# Patient Record
Sex: Male | Born: 1955
Health system: Southern US, Community
[De-identification: ages and names within clinical notes are randomized; demographics above are authoritative.]

## PROBLEM LIST (undated history)

## (undated) DIAGNOSIS — I10 Essential (primary) hypertension: Secondary | ICD-10-CM

## (undated) HISTORY — DX: Essential (primary) hypertension: I10

## (undated) HISTORY — PX: HERNIA REPAIR: SHX51

## (undated) HISTORY — PX: BACK SURGERY: SHX140

## (undated) HISTORY — PX: TONSILLECTOMY: SUR1361

---

## 2015-06-16 ENCOUNTER — Encounter: Payer: Self-pay | Admitting: Sports Medicine

## 2015-06-16 ENCOUNTER — Ambulatory Visit (INDEPENDENT_AMBULATORY_CARE_PROVIDER_SITE_OTHER): Payer: Commercial Managed Care - HMO | Admitting: Sports Medicine

## 2015-06-16 VITALS — BP 147/98 | HR 66 | Wt 204.0 lb

## 2015-06-16 DIAGNOSIS — E781 Pure hyperglyceridemia: Secondary | ICD-10-CM | POA: Diagnosis not present

## 2015-06-16 DIAGNOSIS — Z0000 Encounter for general adult medical examination without abnormal findings: Secondary | ICD-10-CM

## 2015-06-16 DIAGNOSIS — I10 Essential (primary) hypertension: Secondary | ICD-10-CM

## 2015-06-16 DIAGNOSIS — L821 Other seborrheic keratosis: Secondary | ICD-10-CM

## 2015-06-16 MED ORDER — FENOFIBRATE 160 MG PO TABS: 160.0000 mg | ORAL_TABLET | Freq: Every day | ORAL | Status: DC

## 2015-06-16 NOTE — Progress Notes (Signed)
  Subjective:    CC: Establish care.   HPI:  Hypertriglyceridemia: 1200, has never been treated, on review of lipid panels from [facility], he has typically been 300 or greater even when fasting.  Hypertension: No headaches, visual changes, chest pain, stable on lisinopril  Skin lesions: Localized over his chest, back, neck, wonders if these are melanomas.  Preventative measures: Colonoscopy was done at age 59, 7 years ago.  Past medical history, Surgical history, Family history not pertinant except as noted below, Social history, Allergies, and medications have been entered into the medical record, reviewed, and no changes needed.   Review of Systems: No headache, visual changes, nausea, vomiting, diarrhea, constipation, dizziness, abdominal pain, skin rash, fevers, chills, night sweats, swollen lymph nodes, weight loss, chest pain, body aches, joint swelling, muscle aches, shortness of breath, mood changes, visual or auditory hallucinations.  Objective:    General: Well Developed, well nourished, and in no acute distress.  Neuro: Alert and oriented x3, extra-ocular muscles intact, sensation grossly intact.  HEENT: Normocephalic, atraumatic, pupils equal round reactive to light, neck supple, no masses, no lymphadenopathy, thyroid nonpalpable.  Skin: Warm and dry, no rashes noted. There are 3 seborrheic keratoses, one on the chest, one in the back, one on the right neck Cardiac: Regular rate and rhythm, no murmurs rubs or gallops.  Respiratory: Clear to auscultation bilaterally. Not using accessory muscles, speaking in full sentences.  Abdominal: Soft, nontender, nondistended, positive bowel sounds, no masses, no organomegaly.  Musculoskeletal: Shoulder, elbow, wrist, hip, knee, ankle stable, and with full range of motion.  Procedure:  Cryodestruction of  3 seborrheic keratoses  Consent obtained and verified. Time-out conducted. Noted no overlying erythema, induration, or other signs of  local infection. Completed without difficulty using Cryo-Gun. Advised to call if fevers/chills, erythema, induration, drainage, or persistent bleeding.  Impression and Recommendations:    The patient was counselled, risk factors were discussed, anticipatory guidance given.

## 2015-06-16 NOTE — Assessment & Plan Note (Signed)
Up-to-date on screening measures, colonoscopy was when he was 52.

## 2015-06-16 NOTE — Assessment & Plan Note (Signed)
Triglycerides were well over 1000, he does need to start fenofibrate. Recheck triglycerides in 3 months.

## 2015-06-16 NOTE — Assessment & Plan Note (Signed)
Continue lisinopril, no changes in dosage yet.

## 2015-06-16 NOTE — Assessment & Plan Note (Signed)
Cryotherapy of seborrheic keratosis 3.

## 2015-06-17 LAB — HEMOGLOBIN A1C
Hgb A1c MFr Bld: 5.8 % — ABNORMAL HIGH (ref <5.7)
Mean Plasma Glucose: 120 mg/dL — ABNORMAL HIGH (ref <117)

## 2015-06-17 LAB — LIPID PANEL
Cholesterol: 220 mg/dL — ABNORMAL HIGH (ref 125–200)
HDL: 43 mg/dL (ref >=40)
LDL Cholesterol: 123 mg/dL (ref <130)
Total CHOL/HDL Ratio: 5.1 Ratio — ABNORMAL HIGH (ref <=5.0)
Triglycerides: 268 mg/dL — ABNORMAL HIGH (ref <150)
VLDL: 54 mg/dL — ABNORMAL HIGH (ref <30)

## 2015-06-17 LAB — COMPREHENSIVE METABOLIC PANEL WITH GFR
ALT: 61 U/L — ABNORMAL HIGH (ref 9–46)
Albumin: 4.6 g/dL (ref 3.6–5.1)
Alkaline Phosphatase: 44 U/L (ref 40–115)
Calcium: 9.8 mg/dL (ref 8.6–10.3)
Glucose, Bld: 94 mg/dL (ref 65–99)
Potassium: 4.6 mmol/L (ref 3.5–5.3)
Total Protein: 7.2 g/dL (ref 6.1–8.1)

## 2015-06-17 LAB — COMPREHENSIVE METABOLIC PANEL
AST: 39 U/L — ABNORMAL HIGH (ref 10–35)
BUN: 12 mg/dL (ref 7–25)
CO2: 24 mmol/L (ref 20–31)
Chloride: 102 mmol/L (ref 98–110)
Creat: 0.9 mg/dL (ref 0.70–1.33)
Sodium: 141 mmol/L (ref 135–146)
Total Bilirubin: 0.8 mg/dL (ref 0.2–1.2)

## 2015-06-17 LAB — TSH: TSH: 1.653 u[IU]/mL (ref 0.350–4.500)

## 2015-06-17 LAB — VITAMIN D 25 HYDROXY (VIT D DEFICIENCY, FRACTURES): Vit D, 25-Hydroxy: 36 ng/mL (ref 30–100)

## 2015-06-19 LAB — TESTOSTERONE, FREE, TOTAL, SHBG
Sex Hormone Binding: 19 nmol/L — ABNORMAL LOW (ref 22–77)
Testosterone, Free: 65.8 pg/mL (ref 47.0–244.0)
Testosterone-% Free: 2.6 % (ref 1.6–2.9)
Testosterone: 256 ng/dL — ABNORMAL LOW (ref 300–890)

## 2015-07-14 ENCOUNTER — Telehealth: Payer: Self-pay

## 2015-07-14 NOTE — Telephone Encounter (Signed)
-----   Message from Monica Bectonhomas J Thekkekandam, MD sent at 06/19/2015  8:27 AM EST ----- Labs look okay but triglycerides are still high, and testosterone is low. Needs to take fenofibrate as discussed, and if having any degree of fatigue, difficulty gaining lean muscle, or poor libido, we can discuss testosterone supplementation.rich,

## 2015-07-14 NOTE — Telephone Encounter (Signed)
Patient aware of lab results and recommendations. 

## 2015-08-04 ENCOUNTER — Ambulatory Visit (INDEPENDENT_AMBULATORY_CARE_PROVIDER_SITE_OTHER): Payer: Commercial Managed Care - HMO

## 2015-08-04 ENCOUNTER — Encounter: Payer: Self-pay | Admitting: Sports Medicine

## 2015-08-04 ENCOUNTER — Ambulatory Visit (INDEPENDENT_AMBULATORY_CARE_PROVIDER_SITE_OTHER): Payer: Commercial Managed Care - HMO | Admitting: Sports Medicine

## 2015-08-04 VITALS — BP 147/86 | HR 72 | Temp 98.5°F | Resp 16 | Wt 209.7 lb

## 2015-08-04 DIAGNOSIS — J209 Acute bronchitis, unspecified: Secondary | ICD-10-CM

## 2015-08-04 MED ORDER — BENZONATATE 200 MG PO CAPS: 200.0000 mg | ORAL_CAPSULE | Freq: Three times a day (TID) | ORAL | Status: DC | PRN

## 2015-08-04 MED ORDER — FLUTICASONE PROPIONATE 50 MCG/ACT NA SUSP: NASAL | Status: DC

## 2015-08-04 MED ORDER — AZITHROMYCIN 250 MG PO TABS: ORAL_TABLET | ORAL | Status: DC

## 2015-08-04 NOTE — Patient Instructions (Signed)
Acute Bronchitis Bronchitis is inflammation of the airways that extend from the windpipe into the lungs (bronchi). The inflammation often causes mucus to develop. This leads to a cough, which is the most common symptom of bronchitis.  In acute bronchitis, the condition usually develops suddenly and goes away over time, usually in a couple weeks. Smoking, allergies, and asthma can make bronchitis worse. Repeated episodes of bronchitis may cause further lung problems.  CAUSES Acute bronchitis is most often caused by the same virus that causes a cold. The virus can spread from person to person (contagious) through coughing, sneezing, and touching contaminated objects. SIGNS AND SYMPTOMS   Cough.   Fever.   Coughing up mucus.   Body aches.   Chest congestion.   Chills.   Shortness of breath.   Sore throat.  DIAGNOSIS  Acute bronchitis is usually diagnosed through a physical exam. Your health care provider will also ask you questions about your medical history. Tests, such as chest X-rays, are sometimes done to rule out other conditions.  TREATMENT  Acute bronchitis usually goes away in a couple weeks. Oftentimes, no medical treatment is necessary. Medicines are sometimes given for relief of fever or cough. Antibiotic medicines are usually not needed but may be prescribed in certain situations. In some cases, an inhaler may be recommended to help reduce shortness of breath and control the cough. A cool mist vaporizer may also be used to help thin bronchial secretions and make it easier to clear the chest.  HOME CARE INSTRUCTIONS  Get plenty of rest.   Drink enough fluids to keep your urine clear or pale yellow (unless you have a medical condition that requires fluid restriction). Increasing fluids may help thin your respiratory secretions (sputum) and reduce chest congestion, and it will prevent dehydration.   Take medicines only as directed by your health care provider.  If  you were prescribed an antibiotic medicine, finish it all even if you start to feel better.  Avoid smoking and secondhand smoke. Exposure to cigarette smoke or irritating chemicals will make bronchitis worse. If you are a smoker, consider using nicotine gum or skin patches to help control withdrawal symptoms. Quitting smoking will help your lungs heal faster.   Reduce the chances of another bout of acute bronchitis by washing your hands frequently, avoiding people with cold symptoms, and trying not to touch your hands to your mouth, nose, or eyes.   Keep all follow-up visits as directed by your health care provider.  SEEK MEDICAL CARE IF: Your symptoms do not improve after 1 week of treatment.  SEEK IMMEDIATE MEDICAL CARE IF:  You develop an increased fever or chills.   You have chest pain.   You have severe shortness of breath.  You have bloody sputum.   You develop dehydration.  You faint or repeatedly feel like you are going to pass out.  You develop repeated vomiting.  You develop a severe headache. MAKE SURE YOU:   Understand these instructions.  Will watch your condition.  Will get help right away if you are not doing well or get worse.   This information is not intended to replace advice given to you by your health care provider. Make sure you discuss any questions you have with your health care provider.   Document Released: 08/29/2004 Document Revised: 08/12/2014 Document Reviewed: 01/12/2013 Elsevier Interactive Patient Education 2016 [employer]  

## 2015-08-04 NOTE — Assessment & Plan Note (Signed)
Symptoms present for over 2 weeks, azithromycin, Flonase, Tessalon Perles, chest x-ray. Return to see me if no better in 2 weeks.

## 2015-08-04 NOTE — Progress Notes (Signed)
  Subjective:    CC: Coughing  HPI: This is a pleasant [age] year old male, 2 weeks of fevers, chills, nonproductive cough, muscle aches. No GI symptoms, no shortness of breath, no chest pain. Symptoms are moderate, persistent.  Past medical history, Surgical history, Family history not pertinant except as noted below, Social history, Allergies, and medications have been entered into the medical record, reviewed, and no changes needed.   Review of Systems: No fevers, chills, night sweats, weight loss, chest pain, or shortness of breath.   Objective:    General: Well Developed, well nourished, and in no acute distress.  Neuro: Alert and oriented x3, extra-ocular muscles intact, sensation grossly intact.  HEENT: Normocephalic, atraumatic, pupils equal round reactive to light, neck supple, no masses, no lymphadenopathy, thyroid nonpalpable. Oropharynx, nasopharynx, ear canals unremarkable. Skin: Warm and dry, no rashes. Cardiac: Regular rate and rhythm, no murmurs rubs or gallops, no lower extremity edema.  Respiratory: Clear to auscultation bilaterally. Not using accessory muscles, speaking in full sentences.  Impression and Recommendations:    I spent 25 minutes with this patient, greater than 50% was face-to-face time counseling regarding the above diagnoses

## 2015-08-17 ENCOUNTER — Other Ambulatory Visit: Payer: Self-pay

## 2015-08-17 MED ORDER — LISINOPRIL 10 MG PO TABS: 10.0000 mg | ORAL_TABLET | Freq: Every day | ORAL | Status: DC

## 2015-09-18 ENCOUNTER — Encounter: Payer: Self-pay | Admitting: Sports Medicine

## 2015-09-18 ENCOUNTER — Ambulatory Visit (INDEPENDENT_AMBULATORY_CARE_PROVIDER_SITE_OTHER): Payer: Commercial Managed Care - HMO | Admitting: Sports Medicine

## 2015-09-18 VITALS — BP 158/87 | HR 60 | Resp 18 | Wt 210.0 lb

## 2015-09-18 DIAGNOSIS — I10 Essential (primary) hypertension: Secondary | ICD-10-CM | POA: Diagnosis not present

## 2015-09-18 DIAGNOSIS — E291 Testicular hypofunction: Secondary | ICD-10-CM

## 2015-09-18 DIAGNOSIS — E781 Pure hyperglyceridemia: Secondary | ICD-10-CM

## 2015-09-18 LAB — CBC
HCT: 47.1 % (ref 39.0–52.0)
Hemoglobin: 15.9 g/dL (ref 13.0–17.0)
MCH: 30.2 pg (ref 26.0–34.0)
MCHC: 33.8 g/dL (ref 30.0–36.0)
MCV: 89.4 fL (ref 78.0–100.0)
MPV: 10.9 fL (ref 8.6–12.4)
Platelets: 250 K/uL (ref 150–400)
RBC: 5.27 MIL/uL (ref 4.22–5.81)
RDW: 13.4 % (ref 11.5–15.5)
WBC: 7.2 K/uL (ref 4.0–10.5)

## 2015-09-18 LAB — COMPREHENSIVE METABOLIC PANEL
Albumin: 4.3 g/dL (ref 3.6–5.1)
Calcium: 9.5 mg/dL (ref 8.6–10.3)
Chloride: 102 mmol/L (ref 98–110)
Creat: 0.89 mg/dL (ref 0.70–1.33)
Potassium: 4.8 mmol/L (ref 3.5–5.3)
Sodium: 136 mmol/L (ref 135–146)
Total Protein: 6.8 g/dL (ref 6.1–8.1)

## 2015-09-18 LAB — COMPREHENSIVE METABOLIC PANEL WITH GFR
ALT: 64 U/L — ABNORMAL HIGH (ref 9–46)
AST: 40 U/L — ABNORMAL HIGH (ref 10–35)
Alkaline Phosphatase: 35 U/L — ABNORMAL LOW (ref 40–115)
BUN: 12 mg/dL (ref 7–25)
CO2: 24 mmol/L (ref 20–31)
Glucose, Bld: 104 mg/dL — ABNORMAL HIGH (ref 65–99)
Total Bilirubin: 0.6 mg/dL (ref 0.2–1.2)

## 2015-09-18 LAB — LDL CHOLESTEROL, DIRECT: Direct LDL: 139 mg/dL — ABNORMAL HIGH (ref <130)

## 2015-09-18 LAB — LIPID PANEL
Cholesterol: 212 mg/dL — ABNORMAL HIGH (ref 125–200)
HDL: 41 mg/dL (ref >=40)
LDL Cholesterol: 132 mg/dL — ABNORMAL HIGH (ref <130)
Total CHOL/HDL Ratio: 5.2 ratio — ABNORMAL HIGH (ref <=5.0)
Triglycerides: 197 mg/dL — ABNORMAL HIGH (ref <150)
VLDL: 39 mg/dL — ABNORMAL HIGH (ref <30)

## 2015-09-18 MED ORDER — LISINOPRIL 20 MG PO TABS: 20.0000 mg | ORAL_TABLET | Freq: Every day | ORAL | Status: DC

## 2015-09-18 NOTE — Assessment & Plan Note (Addendum)
Rechecking lipids on fenofibrate.  Still elevated, adding atorvastatin

## 2015-09-18 NOTE — Progress Notes (Signed)
  Subjective:    CC: Follow-up  HPI: Seborrheic keratoses: Resolved after cryotherapy  Hypertension: Still elevated, despite lisinopril, no headaches, visual changes, chest pain  Hypertriglyceridemia: Has been on fenofibrate for sometime now, due for a recheck.  Hypogonadism: Is extrinsic fatigue, needs another testosterone level but would like to proceed with topical replacement.  Past medical history, Surgical history, Family history not pertinant except as noted below, Social history, Allergies, and medications have been entered into the medical record, reviewed, and no changes needed.   Review of Systems: No fevers, chills, night sweats, weight loss, chest pain, or shortness of breath.   Objective:    General: Well Developed, well nourished, and in no acute distress.  Neuro: Alert and oriented x3, extra-ocular muscles intact, sensation grossly intact.  HEENT: Normocephalic, atraumatic, pupils equal round reactive to light, neck supple, no masses, no lymphadenopathy, thyroid nonpalpable.  Skin: Warm and dry, no rashes. Cardiac: Regular rate and rhythm, no murmurs rubs or gallops, no lower extremity edema.  Respiratory: Clear to auscultation bilaterally. Not using accessory muscles, speaking in full sentences.  Impression and Recommendations:

## 2015-09-18 NOTE — Assessment & Plan Note (Signed)
Increasing lisinopril to 20 mg.

## 2015-09-18 NOTE — Assessment & Plan Note (Addendum)
Rechecking testosterone, patient would like to do topical supplementation if still low.  Second check is in the normal range, patient needs to start parenteral testosterone supplementation, starting at 200 mg intramuscular every 2 weeks, recheck testosterone in one month.

## 2015-09-19 LAB — TESTOSTERONE, FREE, TOTAL, SHBG
Sex Hormone Binding: 22 nmol/L (ref 22–77)
Testosterone, Free: 82 pg/mL (ref 47.0–244.0)
Testosterone-% Free: 2.5 % (ref 1.6–2.9)
Testosterone: 333 ng/dL (ref 250–827)

## 2015-09-19 MED ORDER — ATORVASTATIN CALCIUM 40 MG PO TABS: 40.0000 mg | ORAL_TABLET | Freq: Every day | ORAL | Status: DC

## 2015-09-19 NOTE — Addendum Note (Signed)
Addended by: Monica Becton on: 09/19/2015 02:00 PM   Modules accepted: Orders

## 2015-09-21 ENCOUNTER — Other Ambulatory Visit: Payer: Self-pay | Admitting: Sports Medicine

## 2015-09-21 MED ORDER — BLUNT NEEDLE 18G X 2" MISC
Status: DC
Start: 2015-09-21 — End: 2017-01-08

## 2015-09-21 MED ORDER — SYRINGE (DISPOSABLE) 3 ML MISC: Status: DC

## 2015-09-21 MED ORDER — HYPODERMIC NEEDLE 22G X 1-1/2" MISC
Status: DC
Start: 2015-09-21 — End: 2015-09-27

## 2015-09-26 ENCOUNTER — Other Ambulatory Visit: Payer: Self-pay

## 2015-09-27 ENCOUNTER — Ambulatory Visit (INDEPENDENT_AMBULATORY_CARE_PROVIDER_SITE_OTHER): Payer: Commercial Managed Care - HMO | Admitting: Sports Medicine

## 2015-09-27 VITALS — BP 139/78 | HR 60

## 2015-09-27 DIAGNOSIS — E291 Testicular hypofunction: Secondary | ICD-10-CM

## 2015-09-27 MED ORDER — "HYPODERMIC NEEDLE 22G X 1-1/2"" MISC": Status: DC

## 2015-09-27 NOTE — Progress Notes (Signed)
Patient came into clinic today for education on administration of Testosterone. Pt brought the multidose vial of testosterone into office today with him, along with needles and syringes. Pharmacy did give him 23g 1" needles to inject with, advised the Pt this was incorrect and not to use them. A correct Rx for 22g 1.5" needles was resent to the pharmacy. Pt was given some needles in office today until he can get the correct length needles. Went into great detail on administration locations, how to apply/remove needles, how to draw up dose ( /ml), administration of injection, and proper disposal of used needles. Stressed the importance of needle safety and went over risk of infection possibilities or contamination that could occur if needles aren't changed after every injection. Pt states he would change the needle each time and alternate injection site from left to right each time. Pt was able to administer the injection in Right thigh while in office today, no complications. No further questions. Advised Pt to contact office if he has any further questions or concerns, verbalized understanding.

## 2015-10-16 ENCOUNTER — Encounter: Payer: Self-pay | Admitting: Sports Medicine

## 2015-10-16 ENCOUNTER — Ambulatory Visit (INDEPENDENT_AMBULATORY_CARE_PROVIDER_SITE_OTHER): Payer: Commercial Managed Care - HMO | Admitting: Sports Medicine

## 2015-10-16 VITALS — BP 136/84 | HR 68 | Resp 18 | Wt 214.1 lb

## 2015-10-16 DIAGNOSIS — E291 Testicular hypofunction: Secondary | ICD-10-CM

## 2015-10-16 DIAGNOSIS — I10 Essential (primary) hypertension: Secondary | ICD-10-CM

## 2015-10-16 DIAGNOSIS — F39 Unspecified mood [affective] disorder: Secondary | ICD-10-CM

## 2015-10-16 MED ORDER — SERTRALINE HCL 25 MG PO TABS: 25.0000 mg | ORAL_TABLET | Freq: Every day | ORAL | Status: DC

## 2015-10-16 MED ORDER — LISINOPRIL-HYDROCHLOROTHIAZIDE 20-25 MG PO TABS: 1.0000 | ORAL_TABLET | Freq: Every day | ORAL | Status: DC

## 2015-10-16 NOTE — Assessment & Plan Note (Signed)
Switching to lisinopril/HCTZ

## 2015-10-16 NOTE — Progress Notes (Signed)
  Subjective:    CC: Follow-up  HPI: Hypertension: Still elevated despite increasing to 20 g of lisinopril, no headaches, visual changes, chest pain. Symptoms are moderate, persistent.  Male hypogonadism: Has had 2 shots, not feeling any difference  Past medical history, Surgical history, Family history not pertinant except as noted below, Social history, Allergies, and medications have been entered into the medical record, reviewed, and no changes needed.   Review of Systems: No fevers, chills, night sweats, weight loss, chest pain, or shortness of breath.   Objective:    General: Well Developed, well nourished, and in no acute distress.  Neuro: Alert and oriented x3, extra-ocular muscles intact, sensation grossly intact.  HEENT: Normocephalic, atraumatic, pupils equal round reactive to light, neck supple, no masses, no lymphadenopathy, thyroid nonpalpable.  Skin: Warm and dry, no rashes. Cardiac: Regular rate and rhythm, no murmurs rubs or gallops, no lower extremity edema.  Respiratory: Clear to auscultation bilaterally. Not using accessory muscles, speaking in full sentences.  Impression and Recommendations:

## 2015-10-16 NOTE — Assessment & Plan Note (Signed)
Has not yet noted any difference after 2 weeks of testosterone supplementation, if no response after his next injection he should discontinue.

## 2015-10-16 NOTE — Assessment & Plan Note (Signed)
Refilling zoloft

## 2015-10-30 ENCOUNTER — Ambulatory Visit (INDEPENDENT_AMBULATORY_CARE_PROVIDER_SITE_OTHER): Payer: Commercial Managed Care - HMO | Admitting: Sports Medicine

## 2015-10-30 ENCOUNTER — Encounter: Payer: Self-pay | Admitting: Sports Medicine

## 2015-10-30 VITALS — BP 131/82 | HR 68 | Resp 18 | Wt 212.4 lb

## 2015-10-30 DIAGNOSIS — E291 Testicular hypofunction: Secondary | ICD-10-CM | POA: Diagnosis not present

## 2015-10-30 DIAGNOSIS — I10 Essential (primary) hypertension: Secondary | ICD-10-CM | POA: Diagnosis not present

## 2015-10-30 DIAGNOSIS — K5732 Diverticulitis of large intestine without perforation or abscess without bleeding: Secondary | ICD-10-CM

## 2015-10-30 MED ORDER — CIPROFLOXACIN HCL 750 MG PO TABS: 750.0000 mg | ORAL_TABLET | Freq: Two times a day (BID) | ORAL | Status: DC

## 2015-10-30 MED ORDER — METRONIDAZOLE 500 MG PO TABS: 500.0000 mg | ORAL_TABLET | Freq: Two times a day (BID) | ORAL | Status: AC

## 2015-10-30 MED ORDER — LISINOPRIL 40 MG PO TABS: 40.0000 mg | ORAL_TABLET | Freq: Every day | ORAL | Status: DC

## 2015-10-30 NOTE — Assessment & Plan Note (Signed)
Erectile dysfunction with lisinopril/HCTZ. Blood pressure is okay on lisinopril 20 mg alone, increasing to 40 mg without diuretic. Return in one month.

## 2015-10-30 NOTE — Assessment & Plan Note (Signed)
Has had 3 injections, not yet feeling of benefit. He will get testosterone levels drawn on Wednesday to check dose.  If T levels are normal we will discontinue injections.

## 2015-10-30 NOTE — Progress Notes (Signed)
  Subjective:    CC: Follow-up  HPI: Hypertension: Insufficient control on 20 mg of lisinopril so we added HCTZ, unfortunately he noted some erectile dysfunction and has since gone back to his lisinopril 20. No headaches, visual changes, chest pain.  Diverticulitis: Noted in the past, he has done well with antibiotics, and fortunately has had a recurrence of pain, left lower quadrant, minimal blood. No constitutional symptoms, no nausea or vomiting.   Male hypogonadism: Has now had 3 testosterone shots, still not feeling much improvement.  Past medical history, Surgical history, Family history not pertinant except as noted below, Social history, Allergies, and medications have been entered into the medical record, reviewed, and no changes needed.   Review of Systems: No fevers, chills, night sweats, weight loss, chest pain, or shortness of breath.   Objective:    General: Well Developed, well nourished, and in no acute distress.  Neuro: Alert and oriented x3, extra-ocular muscles intact, sensation grossly intact.  HEENT: Normocephalic, atraumatic, pupils equal round reactive to light, neck supple, no masses, no lymphadenopathy, thyroid nonpalpable.  Skin: Warm and dry, no rashes. Cardiac: Regular rate and rhythm, no murmurs rubs or gallops, no lower extremity edema.  Respiratory: Clear to auscultation bilaterally. Not using accessory muscles, speaking in full sentences.  Impression and Recommendations:    I spent 25 minutes with this patient, greater than 50% was face-to-face time counseling regarding the above diagnoses

## 2015-10-30 NOTE — Assessment & Plan Note (Signed)
Cipro, Flagyl.  Return if doesn't start to get better in a couple of days.

## 2015-11-02 LAB — TESTOSTERONE: Testosterone: 511 ng/dL (ref 250–827)

## 2015-11-10 ENCOUNTER — Ambulatory Visit: Payer: Commercial Managed Care - HMO | Admitting: Sports Medicine

## 2015-11-13 ENCOUNTER — Ambulatory Visit: Payer: Commercial Managed Care - HMO | Admitting: Sports Medicine

## 2015-11-20 ENCOUNTER — Encounter: Payer: Self-pay | Admitting: Sports Medicine

## 2015-11-20 ENCOUNTER — Ambulatory Visit (INDEPENDENT_AMBULATORY_CARE_PROVIDER_SITE_OTHER): Payer: Commercial Managed Care - HMO | Admitting: Sports Medicine

## 2015-11-20 VITALS — BP 133/81 | HR 68 | Resp 18 | Wt 206.4 lb

## 2015-11-20 DIAGNOSIS — K5732 Diverticulitis of large intestine without perforation or abscess without bleeding: Secondary | ICD-10-CM

## 2015-11-20 NOTE — Assessment & Plan Note (Signed)
Resolved, return as needed 

## 2015-11-20 NOTE — Progress Notes (Signed)
  Subjective:    CC:  Follow-up  HPI: Diverticuli's: Resolved with antibiotics.  Past medical history, Surgical history, Family history not pertinant except as noted below, Social history, Allergies, and medications have been entered into the medical record, reviewed, and no changes needed.   Review of Systems: No fevers, chills, night sweats, weight loss, chest pain, or shortness of breath.   Objective:    General: Well Developed, well nourished, and in no acute distress.  Neuro: Alert and oriented x3, extra-ocular muscles intact, sensation grossly intact.  HEENT: Normocephalic, atraumatic, pupils equal round reactive to light, neck supple, no masses, no lymphadenopathy, thyroid nonpalpable.  Skin: Warm and dry, no rashes. Cardiac: Regular rate and rhythm, no murmurs rubs or gallops, no lower extremity edema.  Respiratory: Clear to auscultation bilaterally. Not using accessory muscles, speaking in full sentences. Abdomen: Soft, nontender, nondistended, normal bowel sounds, no palpable masses, no guarding, rigidity, rebound tenderness.  Impression and Recommendations:

## 2015-12-21 ENCOUNTER — Telehealth: Payer: Self-pay | Admitting: Sports Medicine

## 2015-12-21 MED ORDER — METRONIDAZOLE 500 MG PO TABS: 500.0000 mg | ORAL_TABLET | Freq: Two times a day (BID) | ORAL | Status: DC

## 2015-12-21 MED ORDER — CIPROFLOXACIN HCL 750 MG PO TABS: 750.0000 mg | ORAL_TABLET | Freq: Two times a day (BID) | ORAL | Status: DC

## 2015-12-21 NOTE — Telephone Encounter (Signed)
Adding Flagyl and Cipro, switch to a low residue diet.

## 2015-12-21 NOTE — Telephone Encounter (Signed)
Pt called stating he is having a diverticulitis flare up. Pt states he was advised by PCP to just call when this happens, no OV needed. Will route to PCP for review. Pharmacy on file is correct.

## 2016-04-25 ENCOUNTER — Other Ambulatory Visit: Payer: Self-pay

## 2016-04-25 DIAGNOSIS — F39 Unspecified mood [affective] disorder: Secondary | ICD-10-CM

## 2016-04-25 DIAGNOSIS — E781 Pure hyperglyceridemia: Secondary | ICD-10-CM

## 2016-04-25 MED ORDER — FENOFIBRATE 160 MG PO TABS: 160.0000 mg | ORAL_TABLET | Freq: Every day | ORAL | 3 refills | Status: DC

## 2016-04-25 MED ORDER — SERTRALINE HCL 25 MG PO TABS: 25.0000 mg | ORAL_TABLET | Freq: Every day | ORAL | 3 refills | Status: DC

## 2016-06-17 ENCOUNTER — Ambulatory Visit: Payer: Commercial Managed Care - HMO | Admitting: Sports Medicine

## 2016-08-26 ENCOUNTER — Other Ambulatory Visit: Payer: Self-pay | Admitting: Sports Medicine

## 2016-08-26 DIAGNOSIS — E781 Pure hyperglyceridemia: Secondary | ICD-10-CM

## 2016-10-01 ENCOUNTER — Other Ambulatory Visit: Payer: Self-pay | Admitting: Sports Medicine

## 2016-10-01 MED ORDER — CLOBETASOL PROPIONATE 0.05 % EX CREA: 1.0000 "application " | TOPICAL_CREAM | Freq: Two times a day (BID) | CUTANEOUS | 0 refills | Status: DC

## 2016-10-01 NOTE — Telephone Encounter (Signed)
Pt's wife called requesting refill on clobetasol cream for her husband. States it has been "years" since the last refill, but he uses this PRN for a "spot on his ankle that comes up." Will route to PCP for review. Rx pended and pharmacy on file correct.

## 2016-10-01 NOTE — Telephone Encounter (Signed)
Pt's wife advised.

## 2016-10-02 ENCOUNTER — Telehealth: Payer: Self-pay

## 2016-10-02 NOTE — Telephone Encounter (Signed)
Pre Authorization was sent to Cover My Meds. Key: [id number]

## 2016-10-03 ENCOUNTER — Other Ambulatory Visit: Payer: Self-pay | Admitting: Sports Medicine

## 2016-10-03 MED ORDER — TRIAMCINOLONE ACETONIDE 0.5 % EX OINT: 1.0000 "application " | TOPICAL_OINTMENT | Freq: Two times a day (BID) | CUTANEOUS | 3 refills | Status: DC

## 2016-12-11 ENCOUNTER — Encounter: Payer: Self-pay | Admitting: Sports Medicine

## 2016-12-11 ENCOUNTER — Ambulatory Visit (INDEPENDENT_AMBULATORY_CARE_PROVIDER_SITE_OTHER): Payer: BLUE CROSS/BLUE SHIELD

## 2016-12-11 ENCOUNTER — Ambulatory Visit (INDEPENDENT_AMBULATORY_CARE_PROVIDER_SITE_OTHER): Payer: BLUE CROSS/BLUE SHIELD | Admitting: Sports Medicine

## 2016-12-11 DIAGNOSIS — M19011 Primary osteoarthritis, right shoulder: Secondary | ICD-10-CM

## 2016-12-11 DIAGNOSIS — M7581 Other shoulder lesions, right shoulder: Secondary | ICD-10-CM | POA: Insufficient documentation

## 2016-12-11 DIAGNOSIS — G5623 Lesion of ulnar nerve, bilateral upper limbs: Secondary | ICD-10-CM

## 2016-12-11 NOTE — Assessment & Plan Note (Signed)
Elbow sleeve to be worn bilaterally at night. If no improvement in one month we will make bilateral nighttime splints, if still no improvement I will do a bilateral ulnar nerve hydrodissection in the cubital tunnel, and if still no improvement we will proceed with ulnar nerve transposition surgery.

## 2016-12-11 NOTE — Assessment & Plan Note (Signed)
With significant nocturnal symptoms, subacromial injection as above, formal physical therapy, x-rays. Return in one month.

## 2016-12-11 NOTE — Progress Notes (Signed)
  Subjective:    CC: Right shoulder pain  HPI: This is a pleasant 61 year old male, for years he's had pain that he localizes over the deltoid, worse with overhead activities. At this point it is waking him from sleep. Severe, worsening.  Finger numbness: Bilateral, at night he gets numbness and tingling in the fourth and fifth fingers.  Past medical history:  Negative.  See flowsheet/record as well for more information.  Surgical history: Negative.  See flowsheet/record as well for more information.  Family history: Negative.  See flowsheet/record as well for more information.  Social history: Negative.  See flowsheet/record as well for more information.  Allergies, and medications have been entered into the medical record, reviewed, and no changes needed.   Review of Systems: No fevers, chills, night sweats, weight loss, chest pain, or shortness of breath.   Objective:    General: Well Developed, well nourished, and in no acute distress.  Neuro: Alert and oriented x3, extra-ocular muscles intact, sensation grossly intact.  HEENT: Normocephalic, atraumatic, pupils equal round reactive to light, neck supple, no masses, no lymphadenopathy, thyroid nonpalpable.  Skin: Warm and dry, no rashes. Cardiac: Regular rate and rhythm, no murmurs rubs or gallops, no lower extremity edema.  Respiratory: Clear to auscultation bilaterally. Not using accessory muscles, speaking in full sentences. Right Shoulder: Inspection reveals no abnormalities, atrophy or asymmetry. Palpation is normal with no tenderness over AC joint or bicipital groove. ROM is full in all planes. Rotator cuff strength normal throughout. Positive Neer and Hawkin's tests, empty can. Speeds and Yergason's tests normal. No labral pathology noted with negative Obrien's, negative crank, negative clunk, and good stability. Normal scapular function observed. No painful arc and no drop arm sign. No apprehension sign Bilateral  elbows: Unremarkable to inspection. Range of motion full pronation, supination, flexion, extension. Strength is full to all of the above directions Stable to varus, valgus stress. Negative moving valgus stress test. No discrete areas of tenderness to palpation. Ulnar nerve does not sublux. Positive cubital tunnel Tinel's.  Procedure: Real-time Ultrasound Guided Injection of right subacromial bursa Device: GE Logiq E  Verbal informed consent obtained.  Time-out conducted.  Noted no overlying erythema, induration, or other signs of local infection.  Skin prepped in a sterile fashion.  Local anesthesia: Topical Ethyl chloride.  With sterile technique and under real time ultrasound guidance:  Noted intact supraspinatus, 25-gauge needle advanced into the bursa and 1 mL Kenalog 40, 1 mL lidocaine, 1 mL bupivacaine injected easily Completed without difficulty  Pain immediately resolved suggesting accurate placement of the medication.  Advised to call if fevers/chills, erythema, induration, drainage, or persistent bleeding.  Images permanently stored and available for review in the ultrasound unit.  Impression: Technically successful ultrasound guided injection.  Impression and Recommendations:    Right rotator cuff tendinitis With significant nocturnal symptoms, subacromial injection as above, formal physical therapy, x-rays. Return in one month.  Cubital tunnel syndrome of both upper extremities Elbow sleeve to be worn bilaterally at night. If no improvement in one month we will make bilateral nighttime splints, if still no improvement I will do a bilateral ulnar nerve hydrodissection in the cubital tunnel, and if still no improvement we will proceed with ulnar nerve transposition surgery.

## 2016-12-19 ENCOUNTER — Ambulatory Visit (INDEPENDENT_AMBULATORY_CARE_PROVIDER_SITE_OTHER): Payer: BLUE CROSS/BLUE SHIELD | Admitting: Rehabilitative and Restorative Service Providers"

## 2016-12-19 ENCOUNTER — Encounter: Payer: Self-pay | Admitting: Rehabilitative and Restorative Service Providers"

## 2016-12-19 DIAGNOSIS — M25511 Pain in right shoulder: Secondary | ICD-10-CM | POA: Diagnosis not present

## 2016-12-19 DIAGNOSIS — R293 Abnormal posture: Secondary | ICD-10-CM | POA: Diagnosis not present

## 2016-12-19 DIAGNOSIS — R29898 Other symptoms and signs involving the musculoskeletal system: Secondary | ICD-10-CM | POA: Diagnosis not present

## 2016-12-19 NOTE — Patient Instructions (Signed)
Axial Extension (Chin Tuck)    Pull chin in and lengthen back of neck. Hold _10-15___ seconds while counting out loud. Repeat _5-10___ times. Do __several __ sessions per day.   Shoulder Blade Squeeze    Rotate shoulders back, then squeeze shoulder blades down and back Hold 10-20 sec Repeat _10___ times. Do __several __ sessions per day. Can use swim noodle for posture   Scapula Adduction With Pectoralis Stretch: Low - Standing   Shoulders at 45 hands even with shoulders, keeping weight through legs, shift weight forward until you feel pull or stretch through the front of your chest. Hold _30__ seconds. Do _3__ times, _2-4__ times per day.   Scapula Adduction With Pectoralis Stretch: Mid-Range - Standing   Shoulders at 90 elbows even with shoulders, keeping weight through legs, shift weight forward until you feel pull or strength through the front of your chest. Hold __30_ seconds. Do _3__ times, __2-4_ times per day.   Scapula Adduction With Pectoralis Stretch: High - Standing   Shoulders at 120 hands up high on the doorway, keeping weight on feet, shift weight forward until you feel pull or stretch through the front of your chest. Hold _30__ seconds. Do _3__ times, _2-3__ times per day.  TENS UNIT: This is helpful for muscle pain and spasm.   Search and Purchase a TENS 7000 2nd edition at www.tenspros.com. It should be less than $30.     TENS unit instructions: Do not shower or bathe with the unit on Turn the unit off before removing electrodes or batteries If the electrodes lose stickiness add a drop of water to the electrodes after they are disconnected from the unit and place on plastic sheet. If you continued to have difficulty, call the TENS unit company to purchase more electrodes. Do not apply lotion on the skin area prior to use. Make sure the skin is clean and dry as this will help prolong the life of the electrodes. After use, always check skin for  unusual red areas, rash or other skin difficulties. If there are any skin problems, does not apply electrodes to the same area. Never remove the electrodes from the unit by pulling the wires. Do not use the TENS unit or electrodes other than as directed. Do not change electrode placement without consultating your therapist or physician. Keep 2 fingers with between each electrode.

## 2016-12-19 NOTE — Therapy (Addendum)
[place], [place], [address] Phone: [phone]   Fax:  [phone]  Physical Therapy Evaluation  Patient Details  Name: Thomas Reilly MRN: [id number] Date of Birth: Jan 03, 1956 Referring Provider: Dr Dianah Field  Encounter Date: 12/19/2016      PT End of Session - 12/19/16 1654    Visit Number 1   Number of Visits 12   Date for PT Re-Evaluation 01/30/17   PT Start Time 4235   PT Stop Time 1745   PT Time Calculation (min) 51 min   Activity Tolerance Patient tolerated treatment well      Past Medical History:  Diagnosis Date  . Hypertension     Past Surgical History:  Procedure Laterality Date  . BACK SURGERY    . HERNIA REPAIR    . TONSILLECTOMY      There were no vitals filed for this visit.       Subjective Assessment - 12/19/16 1659    Subjective Pat reports that he fel off an oven ~ 3 years ago and hung by his Rt arm which was stuck - for some time before he got down. He has pain in the Rt shoudler for about 6 months. Symtpoms resolved without treatment. In the past three months he has noticed pain in the Rt shoudler with pain like a "toothache" and got to the point that he could not sleep. He was seen by Dr5/9/18 and received in anjection which has helped "a lot" He can now sleep again.     Pertinent History denies any medical problems; HTN    Diagnostic tests xrays arthritic changes    Patient Stated Goals get rid of pain and be able to return to work like he used to    Currently in Pain? No/denies   Pain Location Shoulder   Pain Orientation Right  deep in the shoulder joint    Pain Type Acute pain   Aggravating Factors  using arm; quick movement with Rt arm    Pain Relieving Factors injection             [facility] PT Assessment - 12/19/16 0001      Assessment   Medical Diagnosis Rt RC tendinitis   Referring Provider Dr Dianah Field   Onset Date/Surgical Date  09/14/16   Hand Dominance Right   Next MD Visit 01/08/17   Prior Therapy none      Precautions   Precautions None     Balance Screen   Has the patient fallen in the past 6 months Yes   How many times? 3   Has the patient had a decrease in activity level because of a fear of falling?  No   Is the patient reluctant to leave their home because of a fear of falling?  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    Leisure farm - wedding venue;      Observation/Other Assessments   Focus on Therapeutic Outcomes (FOTO)  47% limitation      Sensation   Additional Comments both hands - at night and sitting in a car numbness for several years      Posture/Postural Control   Posture Comments head forward; shoudlers rounded and elevated; head of the humerus anterior in orientation; increased  thoracic kyphosis; scapulae abducted and rotated along the thoracic wall      AROM   Right Shoulder Extension 44 Degrees   Right Shoulder Flexion 142 Degrees   Right Shoulder ABduction 158 Degrees   Right Shoulder Internal Rotation 16 Degrees   Right Shoulder External Rotation 85 Degrees   Left Shoulder Extension 46 Degrees   Left Shoulder Flexion 149 Degrees   Left Shoulder ABduction 162 Degrees   Left Shoulder Internal Rotation 31 Degrees   Left Shoulder External Rotation 90 Degrees   Cervical Flexion 62   Cervical Extension 37   Cervical - Right Side Bend 36   Cervical - Left Side Bend 24   Cervical - Right Rotation 58   Cervical - Left Rotation 62     Strength   Overall Strength Comments 5/5 bilat UE's pain with resistive testing Rt ER      Palpation   Spinal mobility hypomobile thoracic and cervical spine with CPA mobs    Palpation comment muscular tightness Rt > Lt pecs/upper trap/leveator                    [facility] Adult PT Treatment/Exercise - 12/19/16 0001      Neuro Re-ed    Neuro Re-ed Details  postural correction      Shoulder Exercises: Supine   Other Supine Exercises prolonged snow angel arms ~ 80 deg abduction      Shoulder Exercises: Standing   Other Standing Exercises scap squeeze 10 sec x 10 with noodle   Other Standing Exercises chin tuck 10 sec x 10 min      Shoulder Exercises: Stretch   Corner Stretch --  3 way doorway stretch 30 sec x 3 each      Moist Heat Therapy   Number Minutes Moist Heat 15 Minutes   Moist Heat Location Shoulder  Rt     Electrical Stimulation   Electrical Stimulation Location Rt shoulder girdle    Electrical Stimulation Action IFC   Electrical Stimulation Parameters to tolerance   Electrical Stimulation Goals Pain;Tone                PT Education - 12/19/16 1729    Education provided Yes   Education Details HEP TENS    Person(s) Educated Patient   Methods Explanation;Demonstration;Tactile cues;Verbal cues;Handout   Comprehension Verbalized understanding;Returned demonstration;Verbal cues required;Tactile cues required             PT Long Term Goals - 12/19/16 1745      PT LONG TERM GOAL #1   Title Improve posture and alignment with patient to demonstrate upright posture with head over shoulders for improved biomechanics of shoulder 01/30/17   Time 6   Period Weeks   Status New     PT LONG TERM GOAL #2   Title Improve scapular control and stability with patient to demonstrate Rt shoulder elevation without painful arc 01/30/17   Time 6   Period Weeks   Status New     PT LONG TERM GOAL #3   Title Patient reports return to full activity level without pain or limitations 01/30/17   Time 6   Period Weeks   Status New     PT LONG TERM GOAL #4   Title Independent with HEP 01/30/17   Time 6   Period Weeks   Status New     PT LONG TERM GOAL #5   Title Improve FOTO to </= 30% limitation 01/30/17  Time 6   Period Weeks   Status New               Plan - 12/19/16 1731    Clinical Impression Statement Thomas Reilly is seen for low complexity evaluation of Rt shoulder pain and dysfunction. He has poor posture and alignment; scapular dyskinesis; painful arc with Rt shoudler elevation; limited shoulder and cervical ROM/mobility; pain with functional activities and sleep. He will benefit from PT to address problems identified and prevent recurrent problems with Rt shoulder.    Rehab Potential Good   PT Frequency 1x / week   PT Duration 6 weeks   PT Treatment/Interventions Patient/family education;ADLs/Self Care Home Management;Neuromuscular re-education;Cryotherapy;Electrical Stimulation;Iontophoresis 61m/ml Dexamethasone;Moist Heat;Ultrasound;Dry needling;Manual techniques;Therapeutic activities;Therapeutic exercise   PT Next Visit Plan postural correction; thoracic and cervical spine mobs; manual work through cervical and anterior shoulder musculature; stretching; strengthening; modalities as indicated.    PT Home Exercise Plan Next visit - check pec stretch; add ball release work?; work posterior shoulder girdle with bands functional activities; mobs for spine; check 1st rib    Consulted and Agree with Plan of Care Patient      Patient will benefit from skilled therapeutic intervention in order to improve the following deficits and impairments:  Postural dysfunction, Improper body mechanics, Pain, Decreased range of motion, Decreased mobility, Decreased activity tolerance  Visit Diagnosis: Acute pain of right shoulder - Plan: PT plan of care cert/re-cert  Other symptoms and signs involving the musculoskeletal system - Plan: PT plan of care cert/re-cert  Abnormal posture - Plan: PT plan of care cert/re-cert     Problem List Patient Active Problem List   Diagnosis Date Noted  . Right rotator cuff tendinitis 12/11/2016  . Cubital tunnel syndrome of both upper extremities 12/11/2016   . Diverticulitis of colon 10/30/2015  . Mood disorder (HStoy 10/16/2015  . Male hypogonadism 09/18/2015  . Essential hypertension, benign 06/16/2015  . Annual physical exam 06/16/2015  . Seborrheic keratosis 06/16/2015  . Hypertriglyceridemia 06/16/2015    Celyn PNilda SimmerPT, MPH  12/19/2016, 5:53 PM  [facility] [address] [address]Phone: [phone]  Fax:  [phone] Name: PReynard ChristoffersenMRN: [id number] of Birth: 806-15-1957  PHYSICAL THERAPY DISCHARGE SUMMARY  Visits from Start of Care: Evaluation only  Current functional level related to goals / functional outcomes: Evaluation only    Remaining deficits: Unknown    Education / Equipment: HEP  Plan: Patient agrees to discharge.  Patient goals were not met. Patient is being discharged due to not returning since the last visit.  ?????     Celyn P. HHelene KelpPT, MPH 01/14/17 11:40 AM

## 2016-12-26 ENCOUNTER — Encounter: Payer: BLUE CROSS/BLUE SHIELD | Admitting: Rehabilitative and Restorative Service Providers"

## 2016-12-29 ENCOUNTER — Other Ambulatory Visit: Payer: Self-pay | Admitting: Sports Medicine

## 2016-12-29 DIAGNOSIS — I10 Essential (primary) hypertension: Secondary | ICD-10-CM

## 2017-01-02 ENCOUNTER — Encounter: Payer: BLUE CROSS/BLUE SHIELD | Admitting: Rehabilitative and Restorative Service Providers"

## 2017-01-08 ENCOUNTER — Ambulatory Visit (INDEPENDENT_AMBULATORY_CARE_PROVIDER_SITE_OTHER): Payer: BLUE CROSS/BLUE SHIELD | Admitting: Sports Medicine

## 2017-01-08 ENCOUNTER — Encounter: Payer: Self-pay | Admitting: Sports Medicine

## 2017-01-08 DIAGNOSIS — Z9189 Other specified personal risk factors, not elsewhere classified: Secondary | ICD-10-CM

## 2017-01-08 DIAGNOSIS — G5623 Lesion of ulnar nerve, bilateral upper limbs: Secondary | ICD-10-CM

## 2017-01-08 DIAGNOSIS — M7581 Other shoulder lesions, right shoulder: Secondary | ICD-10-CM | POA: Diagnosis not present

## 2017-01-08 LAB — COMPREHENSIVE METABOLIC PANEL
AST: 22 U/L (ref 10–35)
Albumin: 4.4 g/dL (ref 3.6–5.1)
CO2: 23 mmol/L (ref 20–31)
Calcium: 9.7 mg/dL (ref 8.6–10.3)
Chloride: 105 mmol/L (ref 98–110)
Glucose, Bld: 90 mg/dL (ref 65–99)
Total Bilirubin: 0.7 mg/dL (ref 0.2–1.2)

## 2017-01-08 LAB — COMPREHENSIVE METABOLIC PANEL WITH GFR
ALT: 25 U/L (ref 9–46)
Alkaline Phosphatase: 34 U/L — ABNORMAL LOW (ref 40–115)
BUN: 22 mg/dL (ref 7–25)
Creat: 0.9 mg/dL (ref 0.70–1.33)
Potassium: 4.5 mmol/L (ref 3.5–5.3)
Sodium: 139 mmol/L (ref 135–146)
Total Protein: 6.8 g/dL (ref 6.1–8.1)

## 2017-01-08 LAB — CBC WITH DIFFERENTIAL/PLATELET
Basophils Absolute: 77 cells/uL (ref 0–200)
Basophils Relative: 1 %
Eosinophils Absolute: 231 {cells}/uL (ref 15–500)
Eosinophils Relative: 3 %
HCT: 46.1 % (ref 38.5–50.0)
Hemoglobin: 15.2 g/dL (ref 13.2–17.1)
Lymphocytes Relative: 21 %
Lymphs Abs: 1617 {cells}/uL (ref 850–3900)
MCH: 29 pg (ref 27.0–33.0)
MCHC: 33 g/dL (ref 32.0–36.0)
MCV: 87.8 fL (ref 80.0–100.0)
MPV: 10.4 fL (ref 7.5–12.5)
Monocytes Absolute: 1001 cells/uL — ABNORMAL HIGH (ref 200–950)
Monocytes Relative: 13 %
Neutro Abs: 4774 cells/uL (ref 1500–7800)
Neutrophils Relative %: 62 %
Platelets: 255 K/uL (ref 140–400)
RBC: 5.25 MIL/uL (ref 4.20–5.80)
RDW: 14.3 % (ref 11.0–15.0)
WBC: 7.7 10*3/uL (ref 3.8–10.8)

## 2017-01-08 NOTE — Assessment & Plan Note (Signed)
Did extremely well with injection and formal physical therapy, overdid it a bit this week and working on his deck but overall doing well.

## 2017-01-08 NOTE — Assessment & Plan Note (Signed)
Elbow sleeves are not preventing flexion nocturnally, symptoms however are not bothering him enough to consider nighttime splinting or hydrodissection at this point.

## 2017-01-08 NOTE — Assessment & Plan Note (Signed)
Patient removed a [place] tick from his foot that had been attached for greater than 24 hours. Checking tickborne serologies.

## 2017-01-08 NOTE — Progress Notes (Signed)
  Subjective:    CC: Follow-up  HPI: Right rotator cuff tendinitis: Resolved with injection. He did have a bit of increased pain after working on the deck this weekend but overall improving again.  Cubital tunnel syndrome: Bilateral, elbow sleeves at night have not prevented bilateral flexion, symptoms are intermittent and mild enough where he doesn't want to consider fiberglass splinting or hydrodissection at this time.  Tick bite: Occurred several days ago, the tick was attached for more than 24 hours, no headaches, constitutional symptoms, myalgias. No rash..  Past medical history:  Negative.  See flowsheet/record as well for more information.  Surgical history: Negative.  See flowsheet/record as well for more information.  Family history: Negative.  See flowsheet/record as well for more information.  Social history: Negative.  See flowsheet/record as well for more information.  Allergies, and medications have been entered into the medical record, reviewed, and no changes needed.   Review of Systems: No fevers, chills, night sweats, weight loss, chest pain, or shortness of breath.   Objective:    General: Well Developed, well nourished, and in no acute distress.  Neuro: Alert and oriented x3, extra-ocular muscles intact, sensation grossly intact.  HEENT: Normocephalic, atraumatic, pupils equal round reactive to light, neck supple, no masses, no lymphadenopathy, thyroid nonpalpable.  Skin: Warm and dry, no rashes. Cardiac: Regular rate and rhythm, no murmurs rubs or gallops, no lower extremity edema.  Respiratory: Clear to auscultation bilaterally. Not using accessory muscles, speaking in full sentences.  Impression and Recommendations:    Cubital tunnel syndrome of both upper extremities Elbow sleeves are not preventing flexion nocturnally, symptoms however are not bothering him enough to consider nighttime splinting or hydrodissection at this point.  Right rotator cuff  tendinitis Did extremely well with injection and formal physical therapy, overdid it a bit this week and working on his deck but overall doing well.  At high risk for tick borne illness Patient removed a [place] tick from his foot that had been attached for greater than 24 hours. Checking tickborne serologies.  I spent 25 minutes with this patient, greater than 50% was face-to-face time counseling regarding the above diagnoses

## 2017-01-09 ENCOUNTER — Encounter: Payer: BLUE CROSS/BLUE SHIELD | Admitting: Rehabilitative and Restorative Service Providers"

## 2017-01-09 LAB — ROCKY MTN SPOTTED FVR ABS PNL(IGG+IGM)
RMSF IgG: NOT DETECTED
RMSF IgM: NOT DETECTED

## 2017-01-09 LAB — LYME AB/WESTERN BLOT REFLEX: B burgdorferi Ab IgG+IgM: <0.9 {index} (ref <0.90)

## 2017-01-10 LAB — EHRLICHIA ANTIBODY PANEL
E chaffeensis (HGE) Ab, IgG: <1:64 {titer}
E chaffeensis (HGE) Ab, IgM: <1:20 {titer}

## 2017-01-16 ENCOUNTER — Encounter: Payer: BLUE CROSS/BLUE SHIELD | Admitting: Rehabilitative and Restorative Service Providers"

## 2017-03-03 ENCOUNTER — Ambulatory Visit (INDEPENDENT_AMBULATORY_CARE_PROVIDER_SITE_OTHER): Payer: BLUE CROSS/BLUE SHIELD | Admitting: Sports Medicine

## 2017-03-03 ENCOUNTER — Encounter: Payer: Self-pay | Admitting: Sports Medicine

## 2017-03-03 DIAGNOSIS — S46211A Strain of muscle, fascia and tendon of other parts of biceps, right arm, initial encounter: Secondary | ICD-10-CM | POA: Insufficient documentation

## 2017-03-03 NOTE — Patient Instructions (Signed)
Biceps Tendon Disruption (Proximal) The proximal biceps tendon is a strong cord of tissue that connects the biceps muscle, on the front of the upper arm, to the shoulder blade. A proximal biceps tendon disruption can include a partial or complete tear of the tendon near where it connects to the bone near the shoulder. A proximal biceps tendon disruption can interfere with the ability to lift the arm in front of the body, stabilize the shoulder, bend the elbow, and turn the hand palm-up (supination). What are the causes? A biceps tendon disruption happens when the tendon is exposed to too much force. This excess force may be caused by:  The elbow being suddenly straightened from a bent position because of an external force. This could happen, for example, while catching a heavy weight or being pulled when waterskiing.  Wear and tear from physical activity.  Breaking a fall with your hand.  What increases the risk? The following factors may make you more likely to develop this condition:  Playing contact sports.  Doing activities or sports that involve throwing or overhead movements, such as racket sports, gymnastics, or baseball.  Doing activities or sports that involve putting sudden force on the arm, such as weightlifting or waterskiing.  Having a weakened tendon. The tendon may be weak because of: ? Long-lasting (chronic) biceps tendinitis. ? Certain medical conditions, such as diabetes or rheumatoid arthritis. ? Repeated corticosteroid use. ? Repetitive overhead movements.  What are the signs or symptoms? Symptoms of this condition may include:  Sudden sharp pain in the front of the shoulder. Pain may get worse during certain movements, such as: ? Lifting or carrying objects. ? Straightening the elbow. ? Throwing or using overhead movements.  Inflammation or a feeling of unusual warmth on the front of the shoulder.  Painful tightening (spasm) of the biceps muscle.  A bulge on  the inside of the upper arm when the elbow is bent.  Bruising in the shoulder or upper arm. This may develop 24-48 hours after the tendon is injured.  Limited range of motion of the shoulder and elbow.  Weakness in the elbow and forearm when: ? Bending the elbow. ? Rotating the wrist.  How is this diagnosed? This condition is diagnosed based on your symptoms, your medical history, and a physical exam. Your health care provider may test the strength and range of motion of your shoulder and elbow. You may have imaging tests, such as X-rays, MRI, or ultrasound. How is this treated? This condition is treated by resting and icing the injured area, and by doing physical therapy exercises. Depending on the severity of your condition, treatment may also include:  Medicines to help relieve pain and inflammation.  Avoiding certain activities that put stress on your shoulder.  One or more injections of medicines (corticosteroids) into your upper arm to help reduce inflammation (rare).  Surgery to repair the tear. This may be needed if nonsurgical treatments do not improve your condition.  Follow these instructions at home: Managing pain, stiffness, and swelling  If directed, put ice on the injured area: ? Put ice in a plastic bag. ? Place a towel between your skin and the bag. ? Leave the ice on for 20 minutes, 2-3 times a day.  Move your fingers often to avoid stiffness and to lessen swelling.  Raise (elevate) the injured area while you are sitting or lying down. Activity  Return to your normal activities as told by your health care provider. Ask your health   care provider what activities are safe for you.  Avoid activities that cause pain or make your condition worse.  Do not lift anything that is heavier than 10 lb (4.5 kg) until your health care provider approves.  Do exercises as told by your health care provider. General instructions  Take over-the-counter and prescription  medicines only as told by your health care provider.  Do not drive or operate heavy machinery while taking prescription pain medicines.  Keep all follow-up visits as told by your health care provider. This is important. How is this prevented?  Warm up and stretch before being active.  Cool down and stretch after being active.  Give your body time to rest between periods of activity.  Make sure to use equipment that fits you.  Be safe and responsible while being active to avoid falls.  Maintain physical fitness, including strength and flexibility. Contact a health care provider if:  You have symptoms that get worse or do not get better after 2 weeks of treatment.  You develop new symptoms. Get help right away if:  You have severe pain.  You develop pain or numbness in your hand.  Your hand feels unusually cold.  Your fingernails turn a dark color, such as blue or gray. This information is not intended to replace advice given to you by your health care provider. Make sure you discuss any questions you have with your health care provider. Document Released: 07/22/2005 Document Revised: 03/28/2016 Document Reviewed: 06/30/2015 Elsevier Interactive Patient Education  2018 Elsevier Inc.  

## 2017-03-03 NOTE — Assessment & Plan Note (Signed)
Strap with compressive dressing, he will do some physical therapy after about a week. We can simply watch this, not all proximal biceps tears require surgical excision. I was able to see the distal biceps as well as palpate it at the insertion of the radial tuberosity. Ultrasound confirmed diagnosis, we may or may not consider an MRI in the near future.

## 2017-03-03 NOTE — Progress Notes (Signed)
  Subjective:    CC: arm pain  HPI: Patient was hammering 2 days ago and felt a pop in bicep. Patient reports immediately feeling pain in bicep. He characterize as sharp at first and now achy. Patient states the pain was an 9/10 in severity at the time of injury and is now a 3/10. Patient noticed marked discoloration 24 hours after injury. Patient has not tried anything for it, including medications.    Past medical history:  Negative.  See flowsheet/record as well for more information.  Surgical history: Negative.  See flowsheet/record as well for more information.  Family history: Negative.  See flowsheet/record as well for more information.  Social history: Negative.  See flowsheet/record as well for more information.  Allergies, and medications have been entered into the medical record, reviewed, and no changes needed.   Review of Systems: No fevers, chills, night sweats, weight loss, chest pain, or shortness of breath.   Objective:    General: Well Developed, well nourished, and in no acute distress.  Neuro: Alert and oriented x3, extra-ocular muscles intact, sensation grossly intact.  HEENT: Normocephalic, atraumatic, pupils equal round reactive to light, neck supple, no masses, no lymphadenopathy, thyroid nonpalpable.  Skin: Warm and dry, no rashes. Cardiac: Regular rate and rhythm, no murmurs rubs or gallops, no lower extremity edema.  Respiratory: Clear to auscultation bilaterally. Not using accessory muscles, speaking in full sentences. MSK: Right arm: Dark purple discoloration present over bicep muscle, Popeye deformity is present Tenderness with palpation of biceps muscle, elbow is not tender to palpation, no tenderness to palpation in bicipital groove or at coracoid process Range of motion is full with flexion and extension of elbow, patient is able to fully supinate and pronate forearm Strength is markedly reduced with supination, strength is 5/5 with flexion, extension, and  pronation   Impression and Recommendations:    61 year-old man presenting with arm pain. Given patient's description of injury and physical exam it is most likely that patient has a proximal biceps rupture. This was confirmed with ultrasound in clinic. Patient will undergo conservative treatment with PT for 1 month. Patient will follow-up in clinic in 1 month to be reassess and determine need for any additional imaging or treatment options.

## 2017-03-06 ENCOUNTER — Ambulatory Visit: Payer: BLUE CROSS/BLUE SHIELD | Admitting: Sports Medicine

## 2017-03-31 ENCOUNTER — Ambulatory Visit: Payer: BLUE CROSS/BLUE SHIELD | Admitting: Sports Medicine

## 2017-04-08 ENCOUNTER — Ambulatory Visit (INDEPENDENT_AMBULATORY_CARE_PROVIDER_SITE_OTHER): Payer: BLUE CROSS/BLUE SHIELD | Admitting: Sports Medicine

## 2017-04-08 ENCOUNTER — Encounter: Payer: Self-pay | Admitting: Sports Medicine

## 2017-04-08 DIAGNOSIS — M7581 Other shoulder lesions, right shoulder: Secondary | ICD-10-CM | POA: Diagnosis not present

## 2017-04-08 NOTE — Progress Notes (Signed)
  Subjective:    CC: Follow-up  HPI: [name] returns, I diagnosed him with a right proximal biceps rupture, his pain is completely resolved with regards to this, bruising has resolved and his arm is functional. He does have known impingement syndrome, this is responded well to occasional subacromial injections. He has been doing some rehabilitation exercises but on further questioning it sounds as though they're more for his cervical spine rather than his rotator cuff. Pain is moderate, persistent, localized over the deltoid and worse with overhead activities.  Past medical history:  Negative.  See flowsheet/record as well for more information.  Surgical history: Negative.  See flowsheet/record as well for more information.  Family history: Negative.  See flowsheet/record as well for more information.  Social history: Negative.  See flowsheet/record as well for more information.  Allergies, and medications have been entered into the medical record, reviewed, and no changes needed.   Review of Systems: No fevers, chills, night sweats, weight loss, chest pain, or shortness of breath.   Objective:    General: Well Developed, well nourished, and in no acute distress.  Neuro: Alert and oriented x3, extra-ocular muscles intact, sensation grossly intact.  HEENT: Normocephalic, atraumatic, pupils equal round reactive to light, neck supple, no masses, no lymphadenopathy, thyroid nonpalpable.  Skin: Warm and dry, no rashes. Cardiac: Regular rate and rhythm, no murmurs rubs or gallops, no lower extremity edema.  Respiratory: Clear to auscultation bilaterally. Not using accessory muscles, speaking in full sentences. Right Shoulder: Inspection reveals no abnormalities, atrophy or asymmetry. Palpation is normal with no tenderness over AC joint or bicipital groove. ROM is full in all planes. Rotator cuff strength normal throughout. Positive Neer and Hawkin's tests, empty can. Speeds and Yergason's  tests normal. No labral pathology noted with negative Obrien's, negative crank, negative clunk, and good stability. Normal scapular function observed. No painful arc and no drop arm sign. No apprehension sign  Procedure: Real-time Ultrasound Guided Injection of right subacromial bursa Device: GE Logiq E  Verbal informed consent obtained.  Time-out conducted.  Noted no overlying erythema, induration, or other signs of local infection.  Skin prepped in a sterile fashion.  Local anesthesia: Topical Ethyl chloride.  With sterile technique and under real time ultrasound guidance:  1 mL Kenalog 40, 1 mL lidocaine, 1 mL bupivacaine injected easily. Completed without difficulty  Pain immediately resolved suggesting accurate placement of the medication.  Advised to call if fevers/chills, erythema, induration, drainage, or persistent bleeding.  Images permanently stored and available for review in the ultrasound unit.  Impression: Technically successful ultrasound guided injection.  Impression and Recommendations:    Right rotator cuff tendinitis Right-sided subacromial injection. Typical impingement type symptoms. The rehabilitation exercises he's been doing home have been more trapezial and cervical spine, I would like him to do a session with formal PT to learn specific rotator cuff strengthening. Return to see me as needed.  ___________________________________________ Ihor Austinhomas J. Benjamin Stainhekkekandam, M.D., ABFM., CAQSM. Primary Care and Sports Medicine [facility]  Adjunct Instructor of Family Medicine  [employer]

## 2017-04-08 NOTE — Assessment & Plan Note (Signed)
Right-sided subacromial injection. Typical impingement type symptoms. The rehabilitation exercises he's been doing home have been more trapezial and cervical spine, I would like him to do a session with formal PT to learn specific rotator cuff strengthening. Return to see me as needed.

## 2017-06-04 ENCOUNTER — Other Ambulatory Visit: Payer: Self-pay | Admitting: Sports Medicine

## 2017-06-04 DIAGNOSIS — F39 Unspecified mood [affective] disorder: Secondary | ICD-10-CM

## 2017-06-18 ENCOUNTER — Other Ambulatory Visit: Payer: Self-pay | Admitting: Sports Medicine

## 2017-06-18 DIAGNOSIS — E781 Pure hyperglyceridemia: Secondary | ICD-10-CM

## 2017-07-04 ENCOUNTER — Encounter: Payer: Self-pay | Admitting: Sports Medicine

## 2017-07-04 ENCOUNTER — Ambulatory Visit: Payer: BLUE CROSS/BLUE SHIELD | Admitting: Sports Medicine

## 2017-07-04 DIAGNOSIS — M7581 Other shoulder lesions, right shoulder: Secondary | ICD-10-CM | POA: Diagnosis not present

## 2017-07-04 NOTE — Assessment & Plan Note (Signed)
Subacromial injection as above, he is a bit early, still has not done any physical therapy. [place] exercises given, Thera-Band. Return to see me as needed.   I do think he is ultimately proceeding towards arthroscopy.

## 2017-07-04 NOTE — Progress Notes (Signed)
  Subjective:    CC: Right shoulder pain  HPI: Thomas Reilly is a pleasant 61 year old male, seen him before for right shoulder pain, multifactorial, he has classic impingement syndrome, he did have a right proximal biceps rupture with a Popeye deformity, overall doing well.  Pain is moderate, persistent, previous injection was almost 3 months ago.  He has not been doing his rehab exercises.  Past medical history:  Negative.  See flowsheet/record as well for more information.  Surgical history: Negative.  See flowsheet/record as well for more information.  Family history: Negative.  See flowsheet/record as well for more information.  Social history: Negative.  See flowsheet/record as well for more information.  Allergies, and medications have been entered into the medical record, reviewed, and no changes needed.   Review of Systems: No fevers, chills, night sweats, weight loss, chest pain, or shortness of breath.   Objective:    General: Well Developed, well nourished, and in no acute distress.  Neuro: Alert and oriented x3, extra-ocular muscles intact, sensation grossly intact.  HEENT: Normocephalic, atraumatic, pupils equal round reactive to light, neck supple, no masses, no lymphadenopathy, thyroid nonpalpable.  Skin: Warm and dry, no rashes. Cardiac: Regular rate and rhythm, no murmurs rubs or gallops, no lower extremity edema.  Respiratory: Clear to auscultation bilaterally. Not using accessory muscles, speaking in full sentences. Right Shoulder: Inspection reveals no abnormalities, atrophy or asymmetry. Palpation is normal with no tenderness over AC joint or bicipital groove. ROM is full in all planes. Rotator cuff strength normal throughout. Positive Neer and Hawkin's tests, empty can. Speeds and Yergason's tests normal. No labral pathology noted with negative Obrien's, negative crank, negative clunk, and good stability. Normal scapular function observed. No painful arc and no drop  arm sign. No apprehension sign  Procedure: Real-time Ultrasound Guided Injection of right subacromial bursa Device: GE Logiq E  Verbal informed consent obtained.  Time-out conducted.  Noted no overlying erythema, induration, or other signs of local infection.  Skin prepped in a sterile fashion.  Local anesthesia: Topical Ethyl chloride.  With sterile technique and under real time ultrasound guidance:  1 mL Kenalog 40, 1 mL lidocaine, 1 mL bupivacaine injected easily. Completed without difficulty  Pain immediately resolved suggesting accurate placement of the medication.  Advised to call if fevers/chills, erythema, induration, drainage, or persistent bleeding.  Images permanently stored and available for review in the ultrasound unit.  Impression: Technically successful ultrasound guided injection.  Impression and Recommendations:    Right rotator cuff tendinitis Subacromial injection as above, he is a bit early, still has not done any physical therapy. [place] exercises given, Thera-Band. Return to see me as needed.   I do think he is ultimately proceeding towards arthroscopy. ___________________________________________ Thomas Reilly, M.D., ABFM., CAQSM. Primary Care and Sports Medicine [facility]  Adjunct Instructor of Family Medicine  [employer]

## 2017-07-05 ENCOUNTER — Other Ambulatory Visit: Payer: Self-pay | Admitting: Sports Medicine

## 2017-07-05 DIAGNOSIS — I10 Essential (primary) hypertension: Secondary | ICD-10-CM

## 2017-07-25 ENCOUNTER — Encounter: Payer: Self-pay | Admitting: Family Medicine

## 2017-07-25 ENCOUNTER — Ambulatory Visit: Payer: BLUE CROSS/BLUE SHIELD | Admitting: Family Medicine

## 2017-07-25 VITALS — BP 131/85 | HR 89 | Ht 69.0 in | Wt 201.0 lb

## 2017-07-25 DIAGNOSIS — S39012A Strain of muscle, fascia and tendon of lower back, initial encounter: Secondary | ICD-10-CM | POA: Diagnosis not present

## 2017-07-25 MED ORDER — METHOCARBAMOL 500 MG PO TABS: 500.0000 mg | ORAL_TABLET | Freq: Three times a day (TID) | ORAL | 1 refills | Status: DC

## 2017-07-25 NOTE — Progress Notes (Signed)
Thomas Reilly is a 61 y.o. male who [facility] today for back pain.  Thomas Reilly notes pain and spasm in his left low back.  This typically occurs when he is laying down to sleep at night and interferes with his sleep.  He will sometimes have pain and spasm as well with back motion.  Symptoms have been going on for about a week.  He denies any change in bowel or bladder function.  He denies any blood in the urine or stool or chills.  He feels well otherwise.  He is tried some over-the-counter medicines which have not helped much.   Past Medical History:  Diagnosis Date  . Hypertension    Past Surgical History:  Procedure Laterality Date  . BACK SURGERY    . HERNIA REPAIR    . TONSILLECTOMY     Social History   Tobacco Use  . Smoking status: Former Smoker    Last attempt to quit: 08/05/1997    Years since quitting: 19.9  . Smokeless tobacco: Never Used  Substance Use Topics  . Alcohol use: Yes    Alcohol/week: 8.4 oz    Types: 14 Standard drinks or equivalent per week     ROS:  As above   Medications: Current Outpatient Medications  Medication Sig Dispense Refill  . atorvastatin (LIPITOR) 40 MG tablet TAKE 1 TABLET (40 MG TOTAL) BY MOUTH DAILY. 90 tablet 2  . Cyanocobalamin (VITAMIN B 12 PO) Take by mouth.    . fenofibrate 160 MG tablet Take 1 tablet (160 mg total) by mouth daily. 90 tablet 3  . Glucosamine HCl (GLUCOSAMINE PO) Take by mouth.    Marland Kitchen. lisinopril (PRINIVIL,ZESTRIL) 40 MG tablet TAKE 1 TABLET (40 MG TOTAL) BY MOUTH DAILY. 90 tablet 0  . Omega-3 Fatty Acids (FISH OIL PO) Take by mouth.    . sertraline (ZOLOFT) 25 MG tablet TAKE 1 TABLET BY MOUTH DAILY 90 tablet 0  . triamcinolone ointment (KENALOG) 0.5 % Apply 1 application topically 2 (two) times daily. To affected area, avoid eyes and face 30 g 3  . methocarbamol (ROBAXIN) 500 MG tablet Take 1 tablet (500 mg total) by mouth 3 (three) times daily. 90 tablet  1   No current facility-administered medications for this visit.    No Known Allergies   Exam:  BP 131/85   Pulse 89   Ht 5\' 9"  (1.753 m)   Wt 201 lb (91.2 kg)   BMI 29.68 kg/m  General: Well Developed, well nourished, and in no acute distress.  Neuro/Psych: Alert and oriented x3, extra-ocular muscles intact, able to move all 4 extremities, sensation grossly intact. Skin: Warm and dry, no rashes noted.  Respiratory: Not using accessory muscles, speaking in full sentences, trachea midline.  Cardiovascular: Pulses palpable, no extremity edema. Abdomen: Does not appear distended. MSK: L-spine: Nontender to spinal midline.  Mildly tender to palpation left lumbar paraspinal muscle group. Normal back motion except for extension which reproduces pain and left rotation and lateral flexion which reproduce pain. Lower extremity strength is equal normal throughout. Normal gait.    No results found for this or any previous visit (from the past 48 hour(s)). No results found.    Assessment and Plan: 61 y.o. male with left lumbosacral strain and spasm.  Plan to treat with methocarbamol heating pad TENS unit and refer to physical therapy if not better.  Recheck as needed.    Orders Placed This Encounter  Procedures  .  Ambulatory referral to Physical Therapy    Referral Priority:   Routine    Referral Type:   Physical Medicine    Referral Reason:   Specialty Services Required    Requested Specialty:   Physical Therapy    Number of Visits Requested:   1   Meds ordered this encounter  Medications  . methocarbamol (ROBAXIN) 500 MG tablet    Sig: Take 1 tablet (500 mg total) by mouth 3 (three) times daily.    Dispense:  90 tablet    Refill:  1    Discussed warning signs or symptoms. Please see discharge instructions. Patient expresses understanding.

## 2017-07-25 NOTE — Patient Instructions (Addendum)
Thank you for coming in today. I think this is muscle spasm.  Most of this time this gets better If not getting better start PT.  Take methocarbinol at bedtime for muscle spasm as needed.  This medicine can make you sleepy.  Take Tylneol as needed.  Use a heating pad.  Use a TENS unit.   TENS UNIT: This is helpful for muscle pain and spasm.   Search and Purchase a TENS 7000 2nd edition at  www.tenspros.com or www.Amazon.com It should be less than $30.     TENS unit instructions: Do not shower or bathe with the unit on Turn the unit off before removing electrodes or batteries If the electrodes lose stickiness add a drop of water to the electrodes after they are disconnected from the unit and place on plastic sheet. If you continued to have difficulty, call the TENS unit company to purchase more electrodes. Do not apply lotion on the skin area prior to use. Make sure the skin is clean and dry as this will help prolong the life of the electrodes. After use, always check skin for unusual red areas, rash or other skin difficulties. If there are any skin problems, does not apply electrodes to the same area. Never remove the electrodes from the unit by pulling the wires. Do not use the TENS unit or electrodes other than as directed. Do not change electrode placement without consultating your therapist or physician. Keep 2 fingers with between each electrode. Wear time ratio is 2:1, on to off times.    For example on for 30 minutes off for 15 minutes and then on for 30 minutes off for 15 minutes    Lumbosacral Strain Lumbosacral strain is an injury that causes pain in the lower back (lumbosacral spine). This injury usually occurs from overstretching the muscles or ligaments along your spine. A strain can affect one or more muscles or cord-like tissues that connect bones to other bones (ligaments). What are the causes? This condition may be caused by:  A hard, direct hit (blow) to the  back.  Excessive stretching of the lower back muscles. This may result from: ? A fall. ? Lifting something heavy. ? Repetitive movements such as bending or crouching.  What increases the risk? The following factors may increase your risk of getting this condition:  Participating in sports or activities that involve: ? A sudden twist of the back. ? Pushing or pulling motions.  Being overweight or obese.  Having poor strength and flexibility, especially tight hamstrings or weak muscles in the back or abdomen.  Having too much of a curve in the lower back.  Having a pelvis that is tilted forward.  What are the signs or symptoms? The main symptom of this condition is pain in the lower back, at the site of the strain. Pain may extend (radiate) down one or both legs. How is this diagnosed? This condition is diagnosed based on:  Your symptoms.  Your medical history.  A physical exam. ? Your health care provider may push on certain areas of your back to determine the source of your pain. ? You may be asked to bend forward, backward, and side to side to assess the severity of your pain and your range of motion.  Imaging tests, such as: ? X-rays. ? MRI.  How is this treated? Treatment for this condition may include:  Putting heat and cold on the affected area.  Medicines to help relieve pain and relax your muscles (muscle  relaxants).  NSAIDs to help reduce swelling and discomfort.  When your symptoms improve, it is important to gradually return to your normal routine as soon as possible to reduce pain, avoid stiffness, and avoid loss of muscle strength. Generally, symptoms should improve within 6 weeks of treatment. However, recovery time varies. Follow these instructions at home: Managing pain, stiffness, and swelling   If directed, put ice on the injured area during the first 24 hours after your strain. ? Put ice in a plastic bag. ? Place a towel between your skin and  the bag. ? Leave the ice on for 20 minutes, 2-3 times a day.  If directed, put heat on the affected area as often as told by your health care provider. Use the heat source that your health care provider recommends, such as a moist heat pack or a heating pad. ? Place a towel between your skin and the heat source. ? Leave the heat on for 20-30 minutes. ? Remove the heat if your skin turns bright red. This is especially important if you are unable to feel pain, heat, or cold. You may have a greater risk of getting burned. Activity  Rest and return to your normal activities as told by your health care provider. Ask your health care provider what activities are safe for you.  Avoid activities that take a lot of energy for as long as told by your health care provider. General instructions  Take over-the-counter and prescription medicines only as told by your health care provider.  Donot drive or use heavy machinery while taking prescription pain medicine.  Do not use any products that contain nicotine or tobacco, such as cigarettes and e-cigarettes. If you need help quitting, ask your health care provider.  Keep all follow-up visits as told by your health care provider. This is important. How is this prevented?  Use correct form when playing sports and lifting heavy objects.  Use good posture when sitting and standing.  Maintain a healthy weight.  Sleep on a mattress with medium firmness to support your back.  Be safe and responsible while being active to avoid falls.  Do at least 150 minutes of moderate-intensity exercise each week, such as brisk walking or water aerobics. Try a form of exercise that takes stress off your back, such as swimming or stationary cycling.  Maintain physical fitness, including: ? Strength. ? Flexibility. ? Cardiovascular fitness. ? Endurance. Contact a health care provider if:  Your back pain does not improve after 6 weeks of treatment.  Your  symptoms get worse. Get help right away if:  Your back pain is severe.  You cannot stand or walk.  You have difficulty controlling when you urinate or when you have a bowel movement.  You feel nauseous or you vomit.  Your feet get very cold.  You have numbness, tingling, weakness, or problems using your arms or legs.  You develop any of the following: ? Shortness of breath. ? Dizziness. ? Pain in your legs. ? Weakness in your buttocks or legs. ? Discoloration of the skin on your toes or legs. This information is not intended to replace advice given to you by your health care provider. Make sure you discuss any questions you have with your health care provider. Document Released: 05/01/2005 Document Revised: 02/09/2016 Document Reviewed: 12/24/2015 Elsevier Interactive Patient Education  [employer]2018 Elsevier Inc.

## 2017-08-27 ENCOUNTER — Other Ambulatory Visit: Payer: Self-pay | Admitting: Sports Medicine

## 2017-08-27 DIAGNOSIS — E781 Pure hyperglyceridemia: Secondary | ICD-10-CM

## 2017-09-04 ENCOUNTER — Other Ambulatory Visit: Payer: Self-pay | Admitting: Sports Medicine

## 2017-09-04 DIAGNOSIS — F39 Unspecified mood [affective] disorder: Secondary | ICD-10-CM

## 2017-10-02 ENCOUNTER — Other Ambulatory Visit: Payer: Self-pay | Admitting: Sports Medicine

## 2017-10-02 DIAGNOSIS — I10 Essential (primary) hypertension: Secondary | ICD-10-CM

## 2017-11-07 ENCOUNTER — Ambulatory Visit: Payer: BLUE CROSS/BLUE SHIELD | Admitting: Sports Medicine

## 2017-11-07 ENCOUNTER — Ambulatory Visit (INDEPENDENT_AMBULATORY_CARE_PROVIDER_SITE_OTHER): Payer: BLUE CROSS/BLUE SHIELD

## 2017-11-07 ENCOUNTER — Encounter: Payer: Self-pay | Admitting: Sports Medicine

## 2017-11-07 DIAGNOSIS — M7582 Other shoulder lesions, left shoulder: Secondary | ICD-10-CM | POA: Diagnosis not present

## 2017-11-07 DIAGNOSIS — M25512 Pain in left shoulder: Secondary | ICD-10-CM | POA: Diagnosis not present

## 2017-11-07 DIAGNOSIS — M1612 Unilateral primary osteoarthritis, left hip: Secondary | ICD-10-CM | POA: Diagnosis not present

## 2017-11-07 DIAGNOSIS — G8929 Other chronic pain: Secondary | ICD-10-CM

## 2017-11-07 DIAGNOSIS — M160 Bilateral primary osteoarthritis of hip: Secondary | ICD-10-CM | POA: Diagnosis not present

## 2017-11-07 DIAGNOSIS — M19012 Primary osteoarthritis, left shoulder: Secondary | ICD-10-CM | POA: Diagnosis not present

## 2017-11-07 DIAGNOSIS — Z96642 Presence of left artificial hip joint: Secondary | ICD-10-CM | POA: Insufficient documentation

## 2017-11-07 NOTE — Assessment & Plan Note (Signed)
Severe pain with loss of range of motion. Hip joint injection, x-rays, rehab exercises given. Return in 1 month.

## 2017-11-07 NOTE — Progress Notes (Signed)
Subjective:    CC: Left shoulder, left hip pain  HPI: Thomas Reilly is a pleasant 62 year old male, he has been putting up a new ceiling in the house for over 2 weeks now, not surprisingly he has worsening pain in his left shoulder, over the deltoid, worse with overhead activities.  He does have a history of right rotator cuff tendinitis that responded well to an injection.  Pain is severe, persistent, localized without radiation.  In addition he is developed severe pain in his left hip, both anteriorly and laterally, with gelling, worse with internal rotation, flexion.  No trauma, no constitutional symptoms.  Oral NSAIDs are ineffective at this point.  I reviewed the past medical history, family history, social history, surgical history, and allergies today and no changes were needed.  Please see the problem list section below in epic for further details.  Past Medical History: Past Medical History:  Diagnosis Date  . Hypertension    Past Surgical History: Past Surgical History:  Procedure Laterality Date  . BACK SURGERY    . HERNIA REPAIR    . TONSILLECTOMY     Social History: Social History   Socioeconomic History  . Marital status: Married    Spouse name: Not on file  . Number of children: Not on file  . Years of education: Not on file  . Highest education level: Not on file  Occupational History  . Occupation: Solicitor  Social Needs  . Financial resource strain: Not on file  . Food insecurity:    Worry: Not on file    Inability: Not on file  . Transportation needs:    Medical: Not on file    Non-medical: Not on file  Tobacco Use  . Smoking status: Former Smoker    Last attempt to quit: 08/05/1997    Years since quitting: 20.2  . Smokeless tobacco: Never Used  Substance and Sexual Activity  . Alcohol use: Yes    Alcohol/week: 8.4 oz    Types: 14 Standard drinks or equivalent per week  . Drug use: No  . Sexual activity: Yes    Partners: Female  Lifestyle  .  Physical activity:    Days per week: Not on file    Minutes per session: Not on file  . Stress: Not on file  Relationships  . Social connections:    Talks on phone: Not on file    Gets together: Not on file    Attends religious service: Not on file    Active member of club or organization: Not on file    Attends meetings of clubs or organizations: Not on file    Relationship status: Not on file  Other Topics Concern  . Not on file  Social History Narrative  . Not on file   Family History: Family History  Problem Relation Age of Onset  . Hypertension Father   . Cancer Father   . Stroke Maternal Grandfather    Allergies: No Known Allergies Medications: See med rec.  Review of Systems: No fevers, chills, night sweats, weight loss, chest pain, or shortness of breath.   Objective:    General: Well Developed, well nourished, and in no acute distress.  Neuro: Alert and oriented x3, extra-ocular muscles intact, sensation grossly intact.  HEENT: Normocephalic, atraumatic, pupils equal round reactive to light, neck supple, no masses, no lymphadenopathy, thyroid nonpalpable.  Skin: Warm and dry, no rashes. Cardiac: Regular rate and rhythm, no murmurs rubs or gallops, no lower extremity edema.  Respiratory: Clear to auscultation bilaterally. Not using accessory muscles, speaking in full sentences. Left shoulder: Inspection reveals no abnormalities, atrophy or asymmetry. Palpation is normal with no tenderness over AC joint or bicipital groove. ROM is full in all planes. Rotator cuff strength normal throughout. Positive Neer and Hawkin's tests, empty can. Speeds and Yergason's tests normal. No labral pathology noted with negative Obrien's, negative crank, negative clunk, and good stability. Normal scapular function observed. No painful arc and no drop arm sign. No apprehension sign Left hip: ROM IR: 10 Deg with pain, ER: 60 Deg, Flexion: 120 Deg, Extension: 100 Deg, Abduction: 45  Deg, Adduction: 45 Deg Strength IR: 5/5, ER: 5/5, Flexion: 5/5, Extension: 5/5, Abduction: 5/5, Adduction: 5/5 Pelvic alignment unremarkable to inspection and palpation. Standing hip rotation and gait without trendelenburg / unsteadiness. Greater trochanter without tenderness to palpation. No tenderness over piriformis. No SI joint tenderness and normal minimal SI movement.  Procedure: Real-time Ultrasound Guided Injection of left subacromial bursa Device: GE Logiq E  Verbal informed consent obtained.  Time-out conducted.  Noted no overlying erythema, induration, or other signs of local infection.  Skin prepped in a sterile fashion.  Local anesthesia: Topical Ethyl chloride.  With sterile technique and under real time ultrasound guidance: Noted distended bursa, 1 cc Kenalog 40, 1 cc lidocaine, 1 cc bupivacaine injected easily Completed without difficulty  Pain immediately resolved suggesting accurate placement of the medication.  Advised to call if fevers/chills, erythema, induration, drainage, or persistent bleeding.  Images permanently stored and available for review in the ultrasound unit.  Impression: Technically successful ultrasound guided injection.  Procedure: Real-time Ultrasound Guided Injection of left femoroacetabular joint Device: GE Logiq E  Verbal informed consent obtained.  Time-out conducted.  Noted no overlying erythema, induration, or other signs of local infection.  Skin prepped in a sterile fashion.  Local anesthesia: Topical Ethyl chloride.  With sterile technique and under real time ultrasound guidance: Noted joint effusion, osteophytes, 22-gauge spinal needle advanced to the femoral head/neck junction, bone contacted and 1 cc kenalog 40, 2 cc lidocaine, 2 cc bupivacaine injected easily Completed without difficulty  Pain immediately resolved suggesting accurate placement of the medication.  Advised to call if fevers/chills, erythema, induration, drainage, or  persistent bleeding.  Images permanently stored and available for review in the ultrasound unit.  Impression: Technically successful ultrasound guided injection.  Impression and Recommendations:    Primary osteoarthritis of left hip Severe pain with loss of range of motion. Hip joint injection, x-rays, rehab exercises given. Return in 1 month.  Rotator cuff tendinitis, left Rehab exercises, subacromial injection, x-rays. Return in 1 month. ___________________________________________ Ihor Austinhomas J. Benjamin Stainhekkekandam, M.D., ABFM., CAQSM. Primary Care and Sports Medicine [facility]  Adjunct Instructor of Family Medicine  [employer]

## 2017-11-07 NOTE — Assessment & Plan Note (Signed)
Rehab exercises, subacromial injection, x-rays. Return in 1 month.

## 2017-11-23 ENCOUNTER — Other Ambulatory Visit: Payer: Self-pay | Admitting: Sports Medicine

## 2017-11-23 DIAGNOSIS — E781 Pure hyperglyceridemia: Secondary | ICD-10-CM

## 2017-12-05 ENCOUNTER — Ambulatory Visit: Payer: BLUE CROSS/BLUE SHIELD | Admitting: Sports Medicine

## 2017-12-08 ENCOUNTER — Other Ambulatory Visit: Payer: Self-pay | Admitting: Sports Medicine

## 2017-12-08 DIAGNOSIS — F39 Unspecified mood [affective] disorder: Secondary | ICD-10-CM

## 2018-02-02 ENCOUNTER — Ambulatory Visit: Payer: BLUE CROSS/BLUE SHIELD | Admitting: Sports Medicine

## 2018-02-02 ENCOUNTER — Encounter: Payer: Self-pay | Admitting: Sports Medicine

## 2018-02-02 DIAGNOSIS — M1612 Unilateral primary osteoarthritis, left hip: Secondary | ICD-10-CM

## 2018-02-02 DIAGNOSIS — Z0000 Encounter for general adult medical examination without abnormal findings: Secondary | ICD-10-CM

## 2018-02-02 NOTE — Assessment & Plan Note (Signed)
Previous injection was about 3 months ago, repeat left hip injection today.

## 2018-02-02 NOTE — Assessment & Plan Note (Signed)
Checking routine labs 

## 2018-02-02 NOTE — Progress Notes (Signed)
Subjective:    CC: Left hip pain  HPI: Thomas Reilly is a pleasant 62 year old male with known left hip osteoarthritis, his most recent injection was approximately 3 months ago.  His arthritis is fairly severe and we did recommend surgical consultation for hip arthroplasty, he really does not have time for this right now.  Pain is severe, worsening, localized at the left groin without radiation.  I reviewed the past medical history, family history, social history, surgical history, and allergies today and no changes were needed.  Please see the problem list section below in epic for further details.  Past Medical History: Past Medical History:  Diagnosis Date  . Hypertension    Past Surgical History: Past Surgical History:  Procedure Laterality Date  . BACK SURGERY    . HERNIA REPAIR    . TONSILLECTOMY     Social History: Social History   Socioeconomic History  . Marital status: Married    Spouse name: Not on file  . Number of children: Not on file  . Years of education: Not on file  . Highest education level: Not on file  Occupational History  . Occupation: Solicitor  Social Needs  . Financial resource strain: Not on file  . Food insecurity:    Worry: Not on file    Inability: Not on file  . Transportation needs:    Medical: Not on file    Non-medical: Not on file  Tobacco Use  . Smoking status: Former Smoker    Last attempt to quit: 08/05/1997    Years since quitting: 20.5  . Smokeless tobacco: Never Used  Substance and Sexual Activity  . Alcohol use: Yes    Alcohol/week: 8.4 oz    Types: 14 Standard drinks or equivalent per week  . Drug use: No  . Sexual activity: Yes    Partners: Female  Lifestyle  . Physical activity:    Days per week: Not on file    Minutes per session: Not on file  . Stress: Not on file  Relationships  . Social connections:    Talks on phone: Not on file    Gets together: Not on file    Attends religious service: Not on file   Active member of club or organization: Not on file    Attends meetings of clubs or organizations: Not on file    Relationship status: Not on file  Other Topics Concern  . Not on file  Social History Narrative  . Not on file   Family History: Family History  Problem Relation Age of Onset  . Hypertension Father   . Cancer Father   . Stroke Maternal Grandfather    Allergies: No Known Allergies Medications: See med rec.  Review of Systems: No fevers, chills, night sweats, weight loss, chest pain, or shortness of breath.   Objective:    General: Well Developed, well nourished, and in no acute distress.  Neuro: Alert and oriented x3, extra-ocular muscles intact, sensation grossly intact.  HEENT: Normocephalic, atraumatic, pupils equal round reactive to light, neck supple, no masses, no lymphadenopathy, thyroid nonpalpable.  Skin: Warm and dry, no rashes. Cardiac: Regular rate and rhythm, no murmurs rubs or gallops, no lower extremity edema.  Respiratory: Clear to auscultation bilaterally. Not using accessory muscles, speaking in full sentences.  Procedure: Real-time Ultrasound Guided Injection of left hip joint Device: GE Logiq E  Verbal informed consent obtained.  Time-out conducted.  Noted no overlying erythema, induration, or other signs of local infection.  Skin prepped in a sterile fashion.  Local anesthesia: Topical Ethyl chloride.  With sterile technique and under real time ultrasound guidance: Using a 22-gauge spinal needle advanced towards the junction of the femoral head and the femoral neck, I contacted bone to ensure intra-articular placement and then injected 1 cc kenalog 40, 2 cc lidocaine, 2 cc bupivacaine. Completed without difficulty  Pain immediately resolved suggesting accurate placement of the medication.  Advised to call if fevers/chills, erythema, induration, drainage, or persistent bleeding.  Images permanently stored and available for review in the  ultrasound unit.  Impression: Technically successful ultrasound guided injection.  Impression and Recommendations:    Primary osteoarthritis of left hip Previous injection was about 3 months ago, repeat left hip injection today.  Annual physical exam Checking routine labs ___________________________________________ Ihor Austinhomas J. Benjamin Stainhekkekandam, M.D., ABFM., CAQSM. Primary Care and Sports Medicine [facility]  Adjunct Instructor of Family Medicine  [employer]

## 2018-02-20 ENCOUNTER — Other Ambulatory Visit: Payer: Self-pay | Admitting: Sports Medicine

## 2018-02-20 DIAGNOSIS — I10 Essential (primary) hypertension: Secondary | ICD-10-CM

## 2018-02-20 DIAGNOSIS — E781 Pure hyperglyceridemia: Secondary | ICD-10-CM

## 2018-03-04 DIAGNOSIS — Z0000 Encounter for general adult medical examination without abnormal findings: Secondary | ICD-10-CM | POA: Diagnosis not present

## 2018-03-04 DIAGNOSIS — Z131 Encounter for screening for diabetes mellitus: Secondary | ICD-10-CM | POA: Diagnosis not present

## 2018-03-05 LAB — COMPREHENSIVE METABOLIC PANEL
AG Ratio: 2 (calc) (ref 1.0–2.5)
ALT: 25 U/L (ref 9–46)
Alkaline phosphatase (APISO): 39 U/L — ABNORMAL LOW (ref 40–115)
CO2: 27 mmol/L (ref 20–32)
Globulin: 2.2 g/dL (calc) (ref 1.9–3.7)
Glucose, Bld: 103 mg/dL — ABNORMAL HIGH (ref 65–99)
Sodium: 137 mmol/L (ref 135–146)
Total Protein: 6.6 g/dL (ref 6.1–8.1)

## 2018-03-05 LAB — COMPREHENSIVE METABOLIC PANEL WITH GFR
AST: 23 U/L (ref 10–35)
Albumin: 4.4 g/dL (ref 3.6–5.1)
BUN: 13 mg/dL (ref 7–25)
Calcium: 9.5 mg/dL (ref 8.6–10.3)
Chloride: 102 mmol/L (ref 98–110)
Creat: 0.95 mg/dL (ref 0.70–1.33)
Potassium: 4.7 mmol/L (ref 3.5–5.3)
Total Bilirubin: 0.9 mg/dL (ref 0.2–1.2)

## 2018-03-05 LAB — LIPID PANEL W/REFLEX DIRECT LDL
Cholesterol: 150 mg/dL (ref <200)
HDL: 60 mg/dL (ref >40)
LDL Cholesterol (Calc): 69 mg/dL (calc)
Non-HDL Cholesterol (Calc): 90 mg/dL (calc) (ref <130)
Total CHOL/HDL Ratio: 2.5 (calc) (ref <5.0)
Triglycerides: 130 mg/dL (ref <150)

## 2018-03-05 LAB — CBC
HCT: 42.8 % (ref 38.5–50.0)
Hemoglobin: 14.6 g/dL (ref 13.2–17.1)
MCH: 30.4 pg (ref 27.0–33.0)
MCHC: 34.1 g/dL (ref 32.0–36.0)
MCV: 89.2 fL (ref 80.0–100.0)
MPV: 10.7 fL (ref 7.5–12.5)
Platelets: 209 Thousand/uL (ref 140–400)
RBC: 4.8 10*6/uL (ref 4.20–5.80)
RDW: 12.8 % (ref 11.0–15.0)
WBC: 6.5 10*3/uL (ref 3.8–10.8)

## 2018-03-05 LAB — HEPATITIS C ANTIBODY
Hepatitis C Ab: NONREACTIVE
SIGNAL TO CUT-OFF: 0.01 (ref <1.00)

## 2018-03-05 LAB — HEMOGLOBIN A1C
Hgb A1c MFr Bld: 5.6 %{Hb} (ref <5.7)
Mean Plasma Glucose: 114 (calc)
eAG (mmol/L): 6.3 (calc)

## 2018-03-05 LAB — TSH: TSH: 1.21 mIU/L (ref 0.40–4.50)

## 2018-03-05 LAB — HIV ANTIBODY (ROUTINE TESTING W REFLEX): HIV 1&2 Ab, 4th Generation: NONREACTIVE

## 2018-03-10 ENCOUNTER — Other Ambulatory Visit: Payer: Self-pay | Admitting: Sports Medicine

## 2018-03-10 DIAGNOSIS — F39 Unspecified mood [affective] disorder: Secondary | ICD-10-CM

## 2018-03-11 ENCOUNTER — Other Ambulatory Visit: Payer: Self-pay | Admitting: Sports Medicine

## 2018-03-11 DIAGNOSIS — E781 Pure hyperglyceridemia: Secondary | ICD-10-CM

## 2018-04-21 ENCOUNTER — Ambulatory Visit (INDEPENDENT_AMBULATORY_CARE_PROVIDER_SITE_OTHER): Payer: BLUE CROSS/BLUE SHIELD | Admitting: Sports Medicine

## 2018-04-21 ENCOUNTER — Encounter: Payer: Self-pay | Admitting: Sports Medicine

## 2018-04-21 DIAGNOSIS — M7582 Other shoulder lesions, left shoulder: Secondary | ICD-10-CM

## 2018-04-21 DIAGNOSIS — M1612 Unilateral primary osteoarthritis, left hip: Secondary | ICD-10-CM | POA: Diagnosis not present

## 2018-04-21 NOTE — Assessment & Plan Note (Signed)
Repeat subacromial injection, previous injection was 5 months ago.

## 2018-04-21 NOTE — Progress Notes (Signed)
Subjective:    CC: Hip and shoulder pain  HPI: [name] returns, he has severe left hip pain, present now for the past week or 2, we did an injection about 2-1/2 months ago.  Localized without radiation.  In addition he has left subacromial bursitis, previous injection was about 5-1/2 months ago, he does his rehabilitation exercises occasionally.  Pain is moderate, persistent, localized over the deltoid and worse with abduction and overhead activities.  I reviewed the past medical history, family history, social history, surgical history, and allergies today and no changes were needed.  Please see the problem list section below in epic for further details.  Past Medical History: Past Medical History:  Diagnosis Date  . Hypertension    Past Surgical History: Past Surgical History:  Procedure Laterality Date  . BACK SURGERY    . HERNIA REPAIR    . TONSILLECTOMY     Social History: Social History   Socioeconomic History  . Marital status: Married    Spouse name: Not on file  . Number of children: Not on file  . Years of education: Not on file  . Highest education level: Not on file  Occupational History  . 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 Financial resource strain: Not on file  . Food insecurity:    Worry: Not on file    Inability: Not on file  . Transportation needs:    Medical: Not on file    Non-medical: Not on file  Tobacco Use  . Smoking status: Former Smoker    Last attempt to quit: 08/05/1997    Years since quitting: 20.7  . Smokeless tobacco: Never Used  Substance and Sexual Activity  . Alcohol use: Yes    Alcohol/week: 14.0 standard drinks    Types: 14 Standard drinks or equivalent per week  . Drug use: No  . Sexual activity: Yes    Partners: Female  Lifestyle  . Physical activity:    Days per week: Not on file    Minutes per session: Not on file  . Stress: Not on file  Relationships  . Social connections:    Talks on phone: Not on file   Gets together: Not on file    Attends religious service: Not on file    Active member of club or organization: Not on file    Attends meetings of clubs or organizations: Not on file    Relationship status: Not on file  Other Topics Concern  . Not on file  Social History Narrative  . Not on file   Family History: Family History  Problem Relation Age of Onset  . Hypertension Father   . Cancer Father   . Stroke Maternal Grandfather    Allergies: No Known Allergies Medications: See med rec.  Review of Systems: No fevers, chills, night sweats, weight loss, chest pain, or shortness of breath.   Objective:    General: Well Developed, well nourished, and in no acute distress.  Neuro: Alert and oriented x3, extra-ocular muscles intact, sensation grossly intact.  HEENT: Normocephalic, atraumatic, pupils equal round reactive to light, neck supple, no masses, no lymphadenopathy, thyroid nonpalpable.  Skin: Warm and dry, no rashes. Cardiac: Regular rate and rhythm, no murmurs rubs or gallops, no lower extremity edema.  Respiratory: Clear to auscultation bilaterally. Not using accessory muscles, speaking in full sentences. Left shoulder: Inspection reveals no abnormalities, atrophy or asymmetry. Palpation is normal with no tenderness over AC joint or bicipital groove. ROM is full in  all planes. Rotator cuff strength normal throughout. Positive Neer and Hawkin's tests, empty can. Speeds and Yergason's tests normal. No labral pathology noted with negative Obrien's, negative crank, negative clunk, and good stability. Normal scapular function observed. No painful arc and no drop arm sign. No apprehension sign  Procedure: Real-time Ultrasound Guided Injection of left subacromial bursa Device: GE Logiq E  Verbal informed consent obtained.  Time-out conducted.  Noted no overlying erythema, induration, or other signs of local infection.  Skin prepped in a sterile fashion.  Local  anesthesia: Topical Ethyl chloride.  With sterile technique and under real time ultrasound guidance: 1 cc kenalog 40, 1 cc lidocaine, 1 cc bupivacaine injected easily. Completed without difficulty  Pain immediately resolved suggesting accurate placement of the medication.  Advised to call if fevers/chills, erythema, induration, drainage, or persistent bleeding.  Images permanently stored and available for review in the ultrasound unit.  Impression: Technically successful ultrasound guided injection.  Procedure: Real-time Ultrasound Guided Injection of left femoroacetabular joint Device: GE Logiq E  Verbal informed consent obtained.  Time-out conducted.  Noted no overlying erythema, induration, or other signs of local infection.  Skin prepped in a sterile fashion.  Local anesthesia: Topical Ethyl chloride.  With sterile technique and under real time ultrasound guidance: 22-gauge spinal needle advanced to the femoral head/neck junction, contacted bone and then injected 1 cc kenalog 40, 2 cc lidocaine, 2 cc bupivacaine. Completed without difficulty  Pain immediately resolved suggesting accurate placement of the medication.  Advised to call if fevers/chills, erythema, induration, drainage, or persistent bleeding.  Images permanently stored and available for review in the ultrasound unit.  Impression: Technically successful ultrasound guided injection.  Impression and Recommendations:    Rotator cuff tendinitis, left Repeat subacromial injection, previous injection was 5 months ago.  Primary osteoarthritis of left hip Hip arthroplasty has been a long time coming. Repeat injection today per patient request, referral to Dr. Luiz Blare, he does need an anterior approach hip arthroplasty.  ___________________________________________ [name]. [name], M.D., ABFM., CAQSM. Primary Care and Sports Medicine [facility]  Adjunct Instructor of Family Medicine  [employer]

## 2018-04-21 NOTE — Assessment & Plan Note (Signed)
Hip arthroplasty has been a long time coming. Repeat injection today per patient request, referral to Dr. Luiz BlareGraves, he does need an anterior approach hip arthroplasty.

## 2018-05-05 DIAGNOSIS — M1612 Unilateral primary osteoarthritis, left hip: Secondary | ICD-10-CM | POA: Diagnosis not present

## 2018-05-05 DIAGNOSIS — S2220XA Unspecified fracture of sternum, initial encounter for closed fracture: Secondary | ICD-10-CM | POA: Diagnosis not present

## 2018-05-26 ENCOUNTER — Encounter: Payer: Self-pay | Admitting: Sports Medicine

## 2018-05-26 ENCOUNTER — Ambulatory Visit (INDEPENDENT_AMBULATORY_CARE_PROVIDER_SITE_OTHER): Payer: BLUE CROSS/BLUE SHIELD | Admitting: Sports Medicine

## 2018-05-26 VITALS — BP 123/76 | HR 67 | Ht 69.0 in | Wt 195.0 lb

## 2018-05-26 DIAGNOSIS — Z01818 Encounter for other preprocedural examination: Secondary | ICD-10-CM | POA: Diagnosis not present

## 2018-05-26 DIAGNOSIS — M1612 Unilateral primary osteoarthritis, left hip: Secondary | ICD-10-CM

## 2018-05-26 LAB — POCT INR: INR: 1 — AB (ref 2.0–3.0)

## 2018-05-26 NOTE — Progress Notes (Signed)
Subjective:    CC: Surgical clearance  HPI: Thomas Reilly is a pleasant 63 year old male, we have been treating severe left hip osteoarthritis for several years now, at this point he has maximized all nonoperative measures, continues to have discomfort, he has seen the surgeon and is a candidate for total hip arthroplasty.  I reviewed the past medical history, family history, social history, surgical history, and allergies today and no changes were needed.  Please see the problem list section below in epic for further details.  Past Medical History: Past Medical History:  Diagnosis Date  . Hypertension    Past Surgical History: Past Surgical History:  Procedure Laterality Date  . BACK SURGERY    . HERNIA REPAIR    . TONSILLECTOMY     Social History: Social History   Socioeconomic History  . Marital status: Married    Spouse name: Not on file  . Number of children: Not on file  . Years of education: Not on file  . Highest education level: Not on file  Occupational History  . Occupation: Solicitor  Social Needs  . Financial resource strain: Not on file  . Food insecurity:    Worry: Not on file    Inability: Not on file  . Transportation needs:    Medical: Not on file    Non-medical: Not on file  Tobacco Use  . Smoking status: Former Smoker    Last attempt to quit: 08/05/1997    Years since quitting: 20.8  . Smokeless tobacco: Never Used  Substance and Sexual Activity  . Alcohol use: Yes    Alcohol/week: 14.0 standard drinks    Types: 14 Standard drinks or equivalent per week  . Drug use: No  . Sexual activity: Yes    Partners: Female  Lifestyle  . Physical activity:    Days per week: Not on file    Minutes per session: Not on file  . Stress: Not on file  Relationships  . Social connections:    Talks on phone: Not on file    Gets together: Not on file    Attends religious service: Not on file    Active member of club or organization: Not on file    Attends  meetings of clubs or organizations: Not on file    Relationship status: Not on file  Other Topics Concern  . Not on file  Social History Narrative  . Not on file   Family History: Family History  Problem Relation Age of Onset  . Hypertension Father   . Cancer Father   . Stroke Maternal Grandfather    Allergies: No Known Allergies Medications: See med rec.  Review of Systems: No fevers, chills, night sweats, weight loss, chest pain, or shortness of breath.   Objective:    General: Well Developed, well nourished, and in no acute distress.  Neuro: Alert and oriented x3, extra-ocular muscles intact, sensation grossly intact.  HEENT: Normocephalic, atraumatic, pupils equal round reactive to light, neck supple, no masses, no lymphadenopathy, thyroid nonpalpable.  Skin: Warm and dry, no rashes. Cardiac: Regular rate and rhythm, no murmurs rubs or gallops, no lower extremity edema.  Respiratory: Clear to auscultation bilaterally. Not using accessory muscles, speaking in full sentences.  Twelve-lead ECG shows normal sinus rhythm, equivocal left axis deviation likely due to hypertension, otherwise normal.  INR = 1.0  Impression and Recommendations:    Primary osteoarthritis of left hip Total hip arthroplasty coming up. Here for surgical clearance. Labs look good including  lipids. ECG is normal. Blood pressure has normalized on recheck. He has greater than 4 metabolic equivalents of exercise tolerance which is sufficient for intermediate risk noncardiac surgery such as a hip arthroplasty. May proceed.  ___________________________________________ [name]. Thomas Reilly, M.D., ABFM., CAQSM. Primary Care and Sports Medicine [facility]  Adjunct Professor of Family Medicine  [employer]

## 2018-05-26 NOTE — Assessment & Plan Note (Addendum)
Total hip arthroplasty coming up. Here for surgical clearance. Labs look good including lipids. ECG is normal. Blood pressure has normalized on recheck. He has greater than 4 metabolic equivalents of exercise tolerance which is sufficient for intermediate risk noncardiac surgery such as a hip arthroplasty. May proceed.

## 2018-06-01 ENCOUNTER — Other Ambulatory Visit: Payer: Self-pay | Admitting: Sports Medicine

## 2018-06-01 DIAGNOSIS — F39 Unspecified mood [affective] disorder: Secondary | ICD-10-CM

## 2018-06-01 DIAGNOSIS — E781 Pure hyperglyceridemia: Secondary | ICD-10-CM

## 2018-06-02 ENCOUNTER — Other Ambulatory Visit: Payer: Self-pay | Admitting: Sports Medicine

## 2018-06-02 DIAGNOSIS — I10 Essential (primary) hypertension: Secondary | ICD-10-CM

## 2018-06-03 DIAGNOSIS — Z96651 Presence of right artificial knee joint: Secondary | ICD-10-CM | POA: Diagnosis not present

## 2018-06-03 DIAGNOSIS — M1612 Unilateral primary osteoarthritis, left hip: Secondary | ICD-10-CM | POA: Diagnosis not present

## 2018-06-18 DIAGNOSIS — Z9889 Other specified postprocedural states: Secondary | ICD-10-CM | POA: Diagnosis not present

## 2018-06-24 ENCOUNTER — Other Ambulatory Visit: Payer: Self-pay | Admitting: Sports Medicine

## 2018-06-30 DIAGNOSIS — Z9889 Other specified postprocedural states: Secondary | ICD-10-CM | POA: Diagnosis not present

## 2018-07-13 DIAGNOSIS — H43393 Other vitreous opacities, bilateral: Secondary | ICD-10-CM | POA: Diagnosis not present

## 2018-08-04 ENCOUNTER — Encounter: Payer: Self-pay | Admitting: Sports Medicine

## 2018-08-04 ENCOUNTER — Ambulatory Visit (INDEPENDENT_AMBULATORY_CARE_PROVIDER_SITE_OTHER): Payer: BLUE CROSS/BLUE SHIELD | Admitting: Sports Medicine

## 2018-08-04 DIAGNOSIS — M7582 Other shoulder lesions, left shoulder: Secondary | ICD-10-CM

## 2018-08-04 DIAGNOSIS — Z96642 Presence of left artificial hip joint: Secondary | ICD-10-CM | POA: Diagnosis not present

## 2018-08-04 NOTE — Assessment & Plan Note (Signed)
Doing extremely well post left hip arthroplasty.

## 2018-08-04 NOTE — Assessment & Plan Note (Signed)
Previous injection was 3-1/2 months ago, repeat subacromial injection today, if we do not get a full 3 months then I will refer him for MRI and surgical intervention.

## 2018-08-04 NOTE — Progress Notes (Signed)
Subjective:    CC: Left shoulder pain  HPI: This is a very pleasant 62 year old male, he has known subacromial bursitis.  Last injection was over 3-1/2 months ago.  Now having a recurrence of pain, moderate, persistent, localized over the deltoid, no radiation.  Desires repeat interventional treatment.  He is post left hip arthroplasty, doing extremely well.  I reviewed the past medical history, family history, social history, surgical history, and allergies today and no changes were needed.  Please see the problem list section below in epic for further details.  Past Medical History: Past Medical History:  Diagnosis Date  . Hypertension    Past Surgical History: Past Surgical History:  Procedure Laterality Date  . BACK SURGERY    . HERNIA REPAIR    . TONSILLECTOMY     Social History: Social History   Socioeconomic History  . Marital status: Married    Spouse name: Not on file  . Number of children: Not on file  . Years of education: Not on file  . Highest education level: Not on file  Occupational History  . 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 Financial resource strain: Not on file  . Food insecurity:    Worry: Not on file    Inability: Not on file  . Transportation needs:    Medical: Not on file    Non-medical: Not on file  Tobacco Use  . Smoking status: Former Smoker    Last attempt to quit: 08/05/1997    Years since quitting: 21.0  . Smokeless tobacco: Never Used  Substance and Sexual Activity  . Alcohol use: Yes    Alcohol/week: 14.0 standard drinks    Types: 14 Standard drinks or equivalent per week  . Drug use: No  . Sexual activity: Yes    Partners: Female  Lifestyle  . Physical activity:    Days per week: Not on file    Minutes per session: Not on file  . Stress: Not on file  Relationships  . Social connections:    Talks on phone: Not on file    Gets together: Not on file    Attends religious service: Not on file    Active member of club  or organization: Not on file    Attends meetings of clubs or organizations: Not on file    Relationship status: Not on file  Other Topics Concern  . Not on file  Social History Narrative  . Not on file   Family History: Family History  Problem Relation Age of Onset  . Hypertension Father   . Cancer Father   . Stroke Maternal Grandfather    Allergies: No Known Allergies Medications: See med rec.  Review of Systems: No fevers, chills, night sweats, weight loss, chest pain, or shortness of breath.   Objective:    General: Well Developed, well nourished, and in no acute distress.  Neuro: Alert and oriented x3, extra-ocular muscles intact, sensation grossly intact.  HEENT: Normocephalic, atraumatic, pupils equal round reactive to light, neck supple, no masses, no lymphadenopathy, thyroid nonpalpable.  Skin: Warm and dry, no rashes. Cardiac: Regular rate and rhythm, no murmurs rubs or gallops, no lower extremity edema.  Respiratory: Clear to auscultation bilaterally. Not using accessory muscles, speaking in full sentences. Left shoulder: Inspection reveals no abnormalities, atrophy or asymmetry. Palpation is normal with no tenderness over AC joint or bicipital groove. ROM is full in all planes. Rotator cuff strength normal throughout. Positive Neer and Hawkin's tests, empty can.  Speeds and Yergason's tests normal. No labral pathology noted with negative Obrien's, negative crank, negative clunk, and good stability. Normal scapular function observed. No painful arc and no drop arm sign. No apprehension sign  Procedure: Real-time Ultrasound Guided Injection of left subacromial bursa Device: GE Logiq E  Verbal informed consent obtained.  Time-out conducted.  Noted no overlying erythema, induration, or other signs of local infection.  Skin prepped in a sterile fashion.  Local anesthesia: Topical Ethyl chloride.  With sterile technique and under real time ultrasound guidance: 1 cc  Kenalog 40, 1 cc lidocaine, 1 cc bupivacaine injected easily Completed without difficulty  Pain immediately resolved suggesting accurate placement of the medication.  Advised to call if fevers/chills, erythema, induration, drainage, or persistent bleeding.  Images permanently stored and available for review in the ultrasound unit.  Impression: Technically successful ultrasound guided injection.  Impression and Recommendations:    History of total left hip arthroplasty Doing extremely well post left hip arthroplasty.  Rotator cuff tendinitis, left Previous injection was 3-1/2 months ago, repeat subacromial injection today, if we do not get a full 3 months then I will refer him for MRI and surgical intervention. ___________________________________________ Ihor Austinhomas J. Benjamin Stainhekkekandam, M.D., ABFM., CAQSM. Primary Care and Sports Medicine [facility]  Adjunct Professor of Family Medicine  [employer]

## 2018-08-11 DIAGNOSIS — Z9889 Other specified postprocedural states: Secondary | ICD-10-CM | POA: Diagnosis not present

## 2018-09-04 ENCOUNTER — Other Ambulatory Visit: Payer: Self-pay | Admitting: Sports Medicine

## 2018-09-04 DIAGNOSIS — I10 Essential (primary) hypertension: Secondary | ICD-10-CM

## 2018-09-11 ENCOUNTER — Other Ambulatory Visit: Payer: Self-pay | Admitting: Sports Medicine

## 2018-09-11 DIAGNOSIS — I10 Essential (primary) hypertension: Secondary | ICD-10-CM

## 2018-09-23 ENCOUNTER — Other Ambulatory Visit: Payer: Self-pay | Admitting: Sports Medicine

## 2018-09-23 DIAGNOSIS — E781 Pure hyperglyceridemia: Secondary | ICD-10-CM

## 2018-10-06 ENCOUNTER — Encounter: Payer: Self-pay | Admitting: Sports Medicine

## 2018-10-06 ENCOUNTER — Ambulatory Visit (INDEPENDENT_AMBULATORY_CARE_PROVIDER_SITE_OTHER): Payer: BLUE CROSS/BLUE SHIELD | Admitting: Sports Medicine

## 2018-10-06 DIAGNOSIS — Z0000 Encounter for general adult medical examination without abnormal findings: Secondary | ICD-10-CM

## 2018-10-06 DIAGNOSIS — M7582 Other shoulder lesions, left shoulder: Secondary | ICD-10-CM

## 2018-10-06 DIAGNOSIS — N529 Male erectile dysfunction, unspecified: Secondary | ICD-10-CM

## 2018-10-06 MED ORDER — SILDENAFIL CITRATE 50 MG PO TABS: 50.0000 mg | ORAL_TABLET | Freq: Every day | ORAL | 11 refills | Status: DC | PRN

## 2018-10-06 NOTE — Progress Notes (Signed)
Subjective:    CC: Left shoulder pain  HPI: Thomas Reilly returns, he is a pleasant 63 year old male, we have been treating him for left shoulder impingement symptoms, he has had several injections, therapy, unfortunately injections are not lasting him very long, he is agreeable to proceed with the next steps.  Symptoms are moderate, present, localized over the deltoid, worse with overhead activities.  Erectile dysfunction: Present slowly over the past few decades, difficulty achieving erection, difficulty with duration, and quality of erection.  He took a friend's Viagra and things worked well.  50 mg.  He did have significant facial flushing.  I reviewed the past medical history, family history, social history, surgical history, and allergies today and no changes were needed.  Please see the problem list section below in epic for further details.  Past Medical History: Past Medical History:  Diagnosis Date  . Hypertension    Past Surgical History: Past Surgical History:  Procedure Laterality Date  . BACK SURGERY    . HERNIA REPAIR    . TONSILLECTOMY     Social History: Social History   Socioeconomic History  . Marital status: Married    Spouse name: Not on file  . Number of children: Not on file  . Years of education: Not on file  . Highest education level: Not on file  Occupational History  . Occupation: Solicitor  Social Needs  . Financial resource strain: Not on file  . Food insecurity:    Worry: Not on file    Inability: Not on file  . Transportation needs:    Medical: Not on file    Non-medical: Not on file  Tobacco Use  . Smoking status: Former Smoker    Last attempt to quit: 08/05/1997    Years since quitting: 21.1  . Smokeless tobacco: Never Used  Substance and Sexual Activity  . Alcohol use: Yes    Alcohol/week: 14.0 standard drinks    Types: 14 Standard drinks or equivalent per week  . Drug use: No  . Sexual activity: Yes    Partners: Female  Lifestyle    . Physical activity:    Days per week: Not on file    Minutes per session: Not on file  . Stress: Not on file  Relationships  . Social connections:    Talks on phone: Not on file    Gets together: Not on file    Attends religious service: Not on file    Active member of club or organization: Not on file    Attends meetings of clubs or organizations: Not on file    Relationship status: Not on file  Other Topics Concern  . Not on file  Social History Narrative  . Not on file   Family History: Family History  Problem Relation Age of Onset  . Hypertension Father   . Cancer Father   . Stroke Maternal Grandfather    Allergies: No Known Allergies Medications: See med rec.  Review of Systems: No fevers, chills, night sweats, weight loss, chest pain, or shortness of breath.   Objective:    General: Well Developed, well nourished, and in no acute distress.  Neuro: Alert and oriented x3, extra-ocular muscles intact, sensation grossly intact.  HEENT: Normocephalic, atraumatic, pupils equal round reactive to light, neck supple, no masses, no lymphadenopathy, thyroid nonpalpable.  Skin: Warm and dry, no rashes. Cardiac: Regular rate and rhythm, no murmurs rubs or gallops, no lower extremity edema.  Respiratory: Clear to auscultation bilaterally. Not using  accessory muscles, speaking in full sentences. Left shoulder: Inspection reveals no abnormalities, atrophy or asymmetry. Palpation is normal with no tenderness over AC joint or bicipital groove. ROM is full in all planes. Rotator cuff strength normal throughout. Positive Neer and Hawkin's tests, empty can. Speeds and Yergason's tests normal. No labral pathology noted with negative Obrien's, negative crank, negative clunk, and good stability. Normal scapular function observed. No painful arc and no drop arm sign. No apprehension sign  Impression and Recommendations:    Rotator cuff tendinitis, left Persistent left shoulder  impingement type symptoms. At this point we are proceeding with MRI and referral to Dr. Ave Filter.   Annual physical exam Colonoscopy scheduled for next month.  Erectile dysfunction Starting with branded Viagra. ___________________________________________ [name]. [name], M.D., ABFM., CAQSM. Primary Care and Sports Medicine [facility]  Adjunct Professor of Family Medicine  [employer]

## 2018-10-06 NOTE — Assessment & Plan Note (Signed)
Persistent left shoulder impingement type symptoms. At this point we are proceeding with MRI and referral to Dr. Ave Filter.

## 2018-10-06 NOTE — Assessment & Plan Note (Signed)
Starting with branded Viagra.

## 2018-10-06 NOTE — Assessment & Plan Note (Signed)
Colonoscopy scheduled for next month.  

## 2018-10-08 ENCOUNTER — Other Ambulatory Visit: Payer: Self-pay

## 2018-10-08 MED ORDER — SILDENAFIL CITRATE 20 MG PO TABS: 20.0000 mg | ORAL_TABLET | Freq: Every day | ORAL | 1 refills | Status: DC | PRN

## 2018-10-08 NOTE — Telephone Encounter (Signed)
Pharmacy called and states patient would like to switch from Sildenafil 50 mg to 20 mg because of the cost. Please advise.

## 2018-10-19 ENCOUNTER — Other Ambulatory Visit: Payer: Self-pay

## 2018-10-19 ENCOUNTER — Ambulatory Visit (INDEPENDENT_AMBULATORY_CARE_PROVIDER_SITE_OTHER): Payer: BLUE CROSS/BLUE SHIELD

## 2018-10-19 ENCOUNTER — Other Ambulatory Visit: Payer: Self-pay | Admitting: Sports Medicine

## 2018-10-19 DIAGNOSIS — M7582 Other shoulder lesions, left shoulder: Secondary | ICD-10-CM | POA: Diagnosis not present

## 2018-10-19 DIAGNOSIS — Z1389 Encounter for screening for other disorder: Secondary | ICD-10-CM

## 2018-10-19 DIAGNOSIS — Z0189 Encounter for other specified special examinations: Secondary | ICD-10-CM

## 2018-10-19 DIAGNOSIS — Z135 Encounter for screening for eye and ear disorders: Secondary | ICD-10-CM | POA: Diagnosis not present

## 2018-10-19 DIAGNOSIS — M19012 Primary osteoarthritis, left shoulder: Secondary | ICD-10-CM | POA: Diagnosis not present

## 2018-10-21 DIAGNOSIS — M75122 Complete rotator cuff tear or rupture of left shoulder, not specified as traumatic: Secondary | ICD-10-CM | POA: Diagnosis not present

## 2018-10-26 ENCOUNTER — Ambulatory Visit (INDEPENDENT_AMBULATORY_CARE_PROVIDER_SITE_OTHER): Payer: BLUE CROSS/BLUE SHIELD | Admitting: Sports Medicine

## 2018-10-26 ENCOUNTER — Ambulatory Visit (INDEPENDENT_AMBULATORY_CARE_PROVIDER_SITE_OTHER): Payer: BLUE CROSS/BLUE SHIELD

## 2018-10-26 ENCOUNTER — Encounter: Payer: Self-pay | Admitting: Sports Medicine

## 2018-10-26 ENCOUNTER — Other Ambulatory Visit: Payer: Self-pay

## 2018-10-26 DIAGNOSIS — R1032 Left lower quadrant pain: Secondary | ICD-10-CM | POA: Diagnosis not present

## 2018-10-26 DIAGNOSIS — K5732 Diverticulitis of large intestine without perforation or abscess without bleeding: Secondary | ICD-10-CM

## 2018-10-26 DIAGNOSIS — K5792 Diverticulitis of intestine, part unspecified, without perforation or abscess without bleeding: Secondary | ICD-10-CM | POA: Diagnosis not present

## 2018-10-26 LAB — COMPREHENSIVE METABOLIC PANEL
ALT: 21 U/L (ref 9–46)
AST: 16 U/L (ref 10–35)
Albumin: 4.5 g/dL (ref 3.6–5.1)
BUN: 15 mg/dL (ref 7–25)
CO2: 24 mmol/L (ref 20–32)
Chloride: 102 mmol/L (ref 98–110)
Creat: 0.91 mg/dL (ref 0.70–1.25)
Globulin: 2.2 g/dL (calc) (ref 1.9–3.7)
Glucose, Bld: 110 mg/dL — ABNORMAL HIGH (ref 65–99)
Potassium: 4.2 mmol/L (ref 3.5–5.3)
Sodium: 137 mmol/L (ref 135–146)
Total Protein: 6.7 g/dL (ref 6.1–8.1)

## 2018-10-26 LAB — CBC WITH DIFFERENTIAL/PLATELET
Absolute Monocytes: 1485 cells/uL — ABNORMAL HIGH (ref 200–950)
Basophils Absolute: 51 cells/uL (ref 0–200)
Basophils Relative: 0.4 %
Eosinophils Absolute: 90 {cells}/uL (ref 15–500)
Eosinophils Relative: 0.7 %
HCT: 44.2 % (ref 38.5–50.0)
Hemoglobin: 15.3 g/dL (ref 13.2–17.1)
Lymphs Abs: 1587 cells/uL (ref 850–3900)
MCH: 29.3 pg (ref 27.0–33.0)
MCHC: 34.6 g/dL (ref 32.0–36.0)
MCV: 84.5 fL (ref 80.0–100.0)
MPV: 10.9 fL (ref 7.5–12.5)
Monocytes Relative: 11.6 %
Neutro Abs: 9587 cells/uL — ABNORMAL HIGH (ref 1500–7800)
Neutrophils Relative %: 74.9 %
Platelets: 227 10*3/uL (ref 140–400)
RBC: 5.23 10*6/uL (ref 4.20–5.80)
RDW: 13.9 % (ref 11.0–15.0)
Total Lymphocyte: 12.4 %
WBC: 12.8 10*3/uL — ABNORMAL HIGH (ref 3.8–10.8)

## 2018-10-26 LAB — COMPREHENSIVE METABOLIC PANEL WITH GFR
AG Ratio: 2 (calc) (ref 1.0–2.5)
Alkaline phosphatase (APISO): 41 U/L (ref 35–144)
Calcium: 9.5 mg/dL (ref 8.6–10.3)
Total Bilirubin: 1.1 mg/dL (ref 0.2–1.2)

## 2018-10-26 LAB — AMYLASE: Amylase: 17 U/L — ABNORMAL LOW (ref 21–101)

## 2018-10-26 LAB — LIPASE: Lipase: 12 U/L (ref 7–60)

## 2018-10-26 MED ORDER — AMOXICILLIN-POT CLAVULANATE 875-125 MG PO TABS: 1.0000 | ORAL_TABLET | Freq: Two times a day (BID) | ORAL | 0 refills | Status: AC

## 2018-10-26 MED ORDER — IOPAMIDOL (ISOVUE-300) INJECTION 61%
100.0000 mL | Freq: Once | INTRAVENOUS | Status: AC | PRN
  Administered 2018-10-26: 100 mL via INTRAVENOUS

## 2018-10-26 NOTE — Assessment & Plan Note (Addendum)
Severe left lower quadrant pain with guarding and rigidity. Adding Augmentin, he was intolerant of metronidazole last time, he does need a CT abdomen pelvis with oral and IV contrast. Adding a CMP, CBC stat. He does have hypertension and we need renal function first. Hopefully he can get the test done this afternoon. He would also like to switch to a different gastroenterologist.

## 2018-10-26 NOTE — Progress Notes (Addendum)
Subjective:    CC: Acute abdominal pain  HPI: This is a pleasant 63 year old male with a history of diverticulitis, who past 2 days had severe left lower quadrant abdominal pain, no signs or symptoms of obstruction.  No bleeding.  He did not tolerate metronidazole last time.  Pain is severe, localized without radiation.  I reviewed the past medical history, family history, social history, surgical history, and allergies today and no changes were needed.  Please see the problem list section below in epic for further details.  Past Medical History: Past Medical History:  Diagnosis Date  . Hypertension    Past Surgical History: Past Surgical History:  Procedure Laterality Date  . BACK SURGERY    . HERNIA REPAIR    . TONSILLECTOMY     Social History: Social History   Socioeconomic History  . Marital status: Married    Spouse name: Not on file  . Number of children: Not on file  . Years of education: Not on file  . Highest education level: Not on file  Occupational History  . Occupation: Solicitor  Social Needs  . Financial resource strain: Not on file  . Food insecurity:    Worry: Not on file    Inability: Not on file  . Transportation needs:    Medical: Not on file    Non-medical: Not on file  Tobacco Use  . Smoking status: Former Smoker    Last attempt to quit: 08/05/1997    Years since quitting: 21.2  . Smokeless tobacco: Never Used  Substance and Sexual Activity  . Alcohol use: Yes    Alcohol/week: 14.0 standard drinks    Types: 14 Standard drinks or equivalent per week  . Drug use: No  . Sexual activity: Yes    Partners: Female  Lifestyle  . Physical activity:    Days per week: Not on file    Minutes per session: Not on file  . Stress: Not on file  Relationships  . Social connections:    Talks on phone: Not on file    Gets together: Not on file    Attends religious service: Not on file    Active member of club or organization: Not on file    Attends  meetings of clubs or organizations: Not on file    Relationship status: Not on file  Other Topics Concern  . Not on file  Social History Narrative  . Not on file   Family History: Family History  Problem Relation Age of Onset  . Hypertension Father   . Cancer Father   . Stroke Maternal Grandfather    Allergies: No Known Allergies Medications: See med rec.  Review of Systems: No fevers, chills, night sweats, weight loss, chest pain, or shortness of breath.   Objective:    General: Well Developed, well nourished, and in no acute distress.  Neuro: Alert and oriented x3, extra-ocular muscles intact, sensation grossly intact.  HEENT: Normocephalic, atraumatic, pupils equal round reactive to light, neck supple, no masses, no lymphadenopathy, thyroid nonpalpable.  Skin: Warm and dry, no rashes. Cardiac: Regular rate and rhythm, no murmurs rubs or gallops, no lower extremity edema.  Respiratory: Clear to auscultation bilaterally. Not using accessory muscles, speaking in full sentences. Abdomen: Somewhat rigid in the left lower quadrant, nondistended, normal bowel sounds, no palpable masses, he does have some rigidity, particularly in the left lower quadrant.  Impression and Recommendations:    Diverticulitis of colon Severe left lower quadrant pain with guarding  and rigidity. Adding Augmentin, he was intolerant of metronidazole last time, he does need a CT abdomen pelvis with oral and IV contrast. Adding a CMP, CBC stat. He does have hypertension and we need renal function first. Hopefully he can get the test done this afternoon. He would also like to switch to a different gastroenterologist.   ___________________________________________ [name]. [name], M.D., ABFM., CAQSM. Primary Care and Sports Medicine [facility]  Adjunct Professor of Family Medicine  [employer]

## 2018-10-26 NOTE — Addendum Note (Signed)
Addended by: Monica Becton on: 10/26/2018 10:53 AM   Modules accepted: Orders

## 2018-11-01 ENCOUNTER — Other Ambulatory Visit: Payer: Self-pay | Admitting: Sports Medicine

## 2018-11-01 DIAGNOSIS — F39 Unspecified mood [affective] disorder: Secondary | ICD-10-CM

## 2018-11-09 ENCOUNTER — Ambulatory Visit: Payer: BLUE CROSS/BLUE SHIELD | Admitting: Sports Medicine

## 2018-11-25 ENCOUNTER — Ambulatory Visit (INDEPENDENT_AMBULATORY_CARE_PROVIDER_SITE_OTHER): Payer: BLUE CROSS/BLUE SHIELD | Admitting: Sports Medicine

## 2018-11-25 ENCOUNTER — Encounter: Payer: Self-pay | Admitting: Sports Medicine

## 2018-11-25 DIAGNOSIS — M7582 Other shoulder lesions, left shoulder: Secondary | ICD-10-CM

## 2018-11-25 NOTE — Assessment & Plan Note (Signed)
Patient requests repeat left shoulder injection because he is months away from rotator cuff repair. Return as needed.

## 2018-11-25 NOTE — Progress Notes (Signed)
Subjective:    CC: Left shoulder pain  HPI: This is a pleasant 63 year old male, he has a left supraspinatus tear, he was scheduled for surgery but unfortunately due to the COVID-19 this is been postponed, continues to have severe pain, localized over the deltoid and worse with overhead activities, waking him from sleep at night.  We have not injected him since December of last year.  He declines any oral medications for pain and is requesting interventional treatment today.  I reviewed the past medical history, family history, social history, surgical history, and allergies today and no changes were needed.  Please see the problem list section below in epic for further details.  Past Medical History: Past Medical History:  Diagnosis Date  . Hypertension    Past Surgical History: Past Surgical History:  Procedure Laterality Date  . BACK SURGERY    . HERNIA REPAIR    . TONSILLECTOMY     Social History: Social History   Socioeconomic History  . Marital status: Married    Spouse name: Not on file  . Number of children: Not on file  . Years of education: Not on file  . Highest education level: Not on file  Occupational History  . 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 Financial resource strain: Not on file  . Food insecurity:    Worry: Not on file    Inability: Not on file  . Transportation needs:    Medical: Not on file    Non-medical: Not on file  Tobacco Use  . Smoking status: Former Smoker    Last attempt to quit: 08/05/1997    Years since quitting: 21.3  . Smokeless tobacco: Never Used  Substance and Sexual Activity  . Alcohol use: Yes    Alcohol/week: 14.0 standard drinks    Types: 14 Standard drinks or equivalent per week  . Drug use: No  . Sexual activity: Yes    Partners: Female  Lifestyle  . Physical activity:    Days per week: Not on file    Minutes per session: Not on file  . Stress: Not on file  Relationships  . Social connections:    Talks on  phone: Not on file    Gets together: Not on file    Attends religious service: Not on file    Active member of club or organization: Not on file    Attends meetings of clubs or organizations: Not on file    Relationship status: Not on file  Other Topics Concern  . Not on file  Social History Narrative  . Not on file   Family History: Family History  Problem Relation Age of Onset  . Hypertension Father   . Cancer Father   . Stroke Maternal Grandfather    Allergies: No Known Allergies Medications: See med rec.  Review of Systems: No fevers, chills, night sweats, weight loss, chest pain, or shortness of breath.   Objective:    General: Well Developed, well nourished, and in no acute distress.  Neuro: Alert and oriented x3, extra-ocular muscles intact, sensation grossly intact.  HEENT: Normocephalic, atraumatic, pupils equal round reactive to light, neck supple, no masses, no lymphadenopathy, thyroid nonpalpable.  Skin: Warm and dry, no rashes. Cardiac: Regular rate and rhythm, no murmurs rubs or gallops, no lower extremity edema.  Respiratory: Clear to auscultation bilaterally. Not using accessory muscles, speaking in full sentences. Left shoulder: Inspection reveals no abnormalities, atrophy or asymmetry. Palpation is normal with no tenderness over [facility]  joint or bicipital groove. ROM is full in all planes. Rotator cuff strength normal throughout. Positive Neer and Hawkin's tests, empty can. Speeds and Yergason's tests normal. No labral pathology noted with negative Obrien's, negative crank, negative clunk, and good stability. Normal scapular function observed. No painful arc and no drop arm sign. No apprehension sign  Procedure: Real-time Ultrasound Guided injection of the left subacromial bursa Device: GE Logiq E  Verbal informed consent obtained.  Time-out conducted.  Noted no overlying erythema, induration, or other signs of local infection.  Skin prepped in a sterile  fashion.  Local anesthesia: Topical Ethyl chloride.  With sterile technique and under real time ultrasound guidance:  1 cc Kenalog 40, 2 cc lidocaine, 2 cc bupivacaine injected easily Completed without difficulty  Pain immediately resolved suggesting accurate placement of the medication.  Advised to call if fevers/chills, erythema, induration, drainage, or persistent bleeding.  Images permanently stored and available for review in the ultrasound unit.  Impression: Technically successful ultrasound guided injection.  Impression and Recommendations:    Rotator cuff tendinitis, left Patient requests repeat left shoulder injection because he is months away from rotator cuff repair. Return as needed.   ___________________________________________ [name]. [name], M.D., ABFM., CAQSM. Primary Care and Sports Medicine [facility]  Adjunct Professor of Family Medicine  [employer] [facility] [employer]

## 2018-12-10 ENCOUNTER — Other Ambulatory Visit: Payer: Self-pay | Admitting: Sports Medicine

## 2018-12-10 DIAGNOSIS — E781 Pure hyperglyceridemia: Secondary | ICD-10-CM

## 2018-12-10 DIAGNOSIS — M25552 Pain in left hip: Secondary | ICD-10-CM | POA: Diagnosis not present

## 2018-12-10 DIAGNOSIS — Z96642 Presence of left artificial hip joint: Secondary | ICD-10-CM | POA: Diagnosis not present

## 2018-12-27 ENCOUNTER — Other Ambulatory Visit: Payer: Self-pay | Admitting: Sports Medicine

## 2018-12-27 DIAGNOSIS — I10 Essential (primary) hypertension: Secondary | ICD-10-CM

## 2018-12-29 ENCOUNTER — Encounter: Payer: Self-pay | Admitting: Sports Medicine

## 2019-02-02 ENCOUNTER — Other Ambulatory Visit: Payer: Self-pay | Admitting: Physician Assistant

## 2019-02-02 DIAGNOSIS — F39 Unspecified mood [affective] disorder: Secondary | ICD-10-CM

## 2019-02-18 DIAGNOSIS — Z1211 Encounter for screening for malignant neoplasm of colon: Secondary | ICD-10-CM | POA: Diagnosis not present

## 2019-02-18 DIAGNOSIS — D122 Benign neoplasm of ascending colon: Secondary | ICD-10-CM | POA: Diagnosis not present

## 2019-02-18 DIAGNOSIS — D123 Benign neoplasm of transverse colon: Secondary | ICD-10-CM | POA: Diagnosis not present

## 2019-02-18 DIAGNOSIS — D125 Benign neoplasm of sigmoid colon: Secondary | ICD-10-CM | POA: Diagnosis not present

## 2019-02-18 LAB — HM COLONOSCOPY

## 2019-02-24 ENCOUNTER — Other Ambulatory Visit: Payer: Self-pay

## 2019-02-24 ENCOUNTER — Encounter: Payer: Self-pay | Admitting: Sports Medicine

## 2019-02-24 ENCOUNTER — Ambulatory Visit (INDEPENDENT_AMBULATORY_CARE_PROVIDER_SITE_OTHER): Payer: BC Managed Care – PPO | Admitting: Sports Medicine

## 2019-02-24 DIAGNOSIS — M7582 Other shoulder lesions, left shoulder: Secondary | ICD-10-CM | POA: Diagnosis not present

## 2019-02-24 NOTE — Assessment & Plan Note (Signed)
Subacromial decompression planned with Dr. Tamera Punt, because wedding season is now in full swing he needs to wait till December, subacromial injection today, previous injection was 3 months ago. He does understand that this injection will delay surgery for 6 to 12 weeks.

## 2019-02-24 NOTE — Progress Notes (Signed)
Subjective:    CC: Left shoulder pain  HPI: Thomas Reilly has rotator cuff tearing and tendinopathy, his last injection was 3 months ago, we had referred him to Dr. Ave Filterhandler to discuss subacromial decompression and repair if needed, wedding season started so he is unable to proceed with surgery until December.  Pain is moderate, persistent, localized over the deltoid and worse with overhead activities, waking him from sleep.  I reviewed the past medical history, family history, social history, surgical history, and allergies today and no changes were needed.  Please see the problem list section below in epic for further details.  Past Medical History: Past Medical History:  Diagnosis Date  . Hypertension    Past Surgical History: Past Surgical History:  Procedure Laterality Date  . BACK SURGERY    . HERNIA REPAIR    . TONSILLECTOMY     Social History: Social History   Socioeconomic History  . Marital status: Married    Spouse name: Not on file  . Number of children: Not on file  . Years of education: Not on file  . Highest education level: Not on file  Occupational History  . 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 Financial resource strain: Not on file  . Food insecurity    Worry: Not on file    Inability: Not on file  . Transportation needs    Medical: Not on file    Non-medical: Not on file  Tobacco Use  . Smoking status: Former Smoker    Quit date: 08/05/1997    Years since quitting: 21.5  . Smokeless tobacco: Never Used  Substance and Sexual Activity  . Alcohol use: Yes    Alcohol/week: 14.0 standard drinks    Types: 14 Standard drinks or equivalent per week  . Drug use: No  . Sexual activity: Yes    Partners: Female  Lifestyle  . Physical activity    Days per week: Not on file    Minutes per session: Not on file  . Stress: Not on file  Relationships  . Social Musicianconnections    Talks on phone: Not on file    Gets together: Not on file    Attends religious  service: Not on file    Active member of club or organization: Not on file    Attends meetings of clubs or organizations: Not on file    Relationship status: Not on file  Other Topics Concern  . Not on file  Social History Narrative  . Not on file   Family History: Family History  Problem Relation Age of Onset  . Hypertension Father   . Cancer Father   . Stroke Maternal Grandfather    Allergies: No Known Allergies Medications: See med rec.  Review of Systems: No fevers, chills, night sweats, weight loss, chest pain, or shortness of breath.   Objective:    General: Well Developed, well nourished, and in no acute distress.  Neuro: Alert and oriented x3, extra-ocular muscles intact, sensation grossly intact.  HEENT: Normocephalic, atraumatic, pupils equal round reactive to light, neck supple, no masses, no lymphadenopathy, thyroid nonpalpable.  Skin: Warm and dry, no rashes. Cardiac: Regular rate and rhythm, no murmurs rubs or gallops, no lower extremity edema.  Respiratory: Clear to auscultation bilaterally. Not using accessory muscles, speaking in full sentences. Left Shoulder: Inspection reveals no abnormalities, atrophy or asymmetry. Palpation is normal with no tenderness over AC joint or bicipital groove. ROM is full in all planes. Rotator cuff strength normal  throughout. Positive Neer and Hawkin's tests, empty can. Speeds and Yergason's tests normal. No labral pathology noted with negative Obrien's, negative crank, negative clunk, and good stability. Normal scapular function observed. No painful arc and no drop arm sign. No apprehension sign  Procedure: Real-time Ultrasound Guided injection of the left subacromial bursa Device: GE Logiq E  Verbal informed consent obtained.  Time-out conducted.  Noted no overlying erythema, induration, or other signs of local infection.  Skin prepped in a sterile fashion.  Local anesthesia: Topical Ethyl chloride.  With sterile  technique and under real time ultrasound guidance: 1 cc Kenalog 40, 2 cc lidocaine, 2 cc bupivacaine injected easily Completed without difficulty  Pain immediately resolved suggesting accurate placement of the medication.  Advised to call if fevers/chills, erythema, induration, drainage, or persistent bleeding.  Images permanently stored and available for review in the ultrasound unit.  Impression: Technically successful ultrasound guided injection.    Impression and Recommendations:    Rotator cuff tendinitis, left Subacromial decompression planned with Dr. Tamera Punt, because wedding season is now in full swing he needs to wait till December, subacromial injection today, previous injection was 3 months ago. He does understand that this injection will delay surgery for 6 to 12 weeks.   ___________________________________________ Thomas Reilly. Thomas Reilly, M.D., ABFM., CAQSM. Primary Care and Sports Medicine [facility]  Adjunct Professor of [employer]

## 2019-03-12 ENCOUNTER — Other Ambulatory Visit: Payer: Self-pay | Admitting: Sports Medicine

## 2019-03-12 DIAGNOSIS — E781 Pure hyperglyceridemia: Secondary | ICD-10-CM

## 2019-03-12 DIAGNOSIS — I10 Essential (primary) hypertension: Secondary | ICD-10-CM

## 2019-03-12 MED ORDER — LISINOPRIL 40 MG PO TABS: 40.0000 mg | ORAL_TABLET | Freq: Every day | ORAL | 1 refills | Status: DC

## 2019-03-12 MED ORDER — FENOFIBRATE 160 MG PO TABS: 160.0000 mg | ORAL_TABLET | Freq: Every day | ORAL | 1 refills | Status: DC

## 2019-06-10 ENCOUNTER — Other Ambulatory Visit: Payer: Self-pay | Admitting: Sports Medicine

## 2019-06-10 DIAGNOSIS — M25552 Pain in left hip: Secondary | ICD-10-CM | POA: Diagnosis not present

## 2019-06-10 DIAGNOSIS — Z96642 Presence of left artificial hip joint: Secondary | ICD-10-CM | POA: Diagnosis not present

## 2019-06-10 DIAGNOSIS — E781 Pure hyperglyceridemia: Secondary | ICD-10-CM

## 2019-06-10 MED ORDER — ATORVASTATIN CALCIUM 40 MG PO TABS: 40.0000 mg | ORAL_TABLET | Freq: Every day | ORAL | 1 refills | Status: DC

## 2019-07-21 ENCOUNTER — Other Ambulatory Visit: Payer: Self-pay | Admitting: Sports Medicine

## 2019-07-21 DIAGNOSIS — F39 Unspecified mood [affective] disorder: Secondary | ICD-10-CM

## 2019-07-21 DIAGNOSIS — I10 Essential (primary) hypertension: Secondary | ICD-10-CM

## 2019-10-18 ENCOUNTER — Other Ambulatory Visit: Payer: Self-pay | Admitting: Sports Medicine

## 2019-10-18 DIAGNOSIS — E781 Pure hyperglyceridemia: Secondary | ICD-10-CM

## 2019-10-19 ENCOUNTER — Ambulatory Visit: Payer: BC Managed Care – PPO | Admitting: Sports Medicine

## 2019-10-19 ENCOUNTER — Other Ambulatory Visit: Payer: Self-pay

## 2019-10-19 DIAGNOSIS — N529 Male erectile dysfunction, unspecified: Secondary | ICD-10-CM | POA: Diagnosis not present

## 2019-10-19 DIAGNOSIS — M7582 Other shoulder lesions, left shoulder: Secondary | ICD-10-CM

## 2019-10-19 DIAGNOSIS — H524 Presbyopia: Secondary | ICD-10-CM | POA: Diagnosis not present

## 2019-10-19 MED ORDER — SILDENAFIL CITRATE 20 MG PO TABS: 20.0000 mg | ORAL_TABLET | ORAL | 11 refills | Status: DC | PRN

## 2019-10-19 MED ORDER — MELOXICAM 15 MG PO TABS: ORAL_TABLET | ORAL | 3 refills | Status: DC

## 2019-10-19 NOTE — Assessment & Plan Note (Signed)
Calling in low dose sildenafil with a discount coupon.

## 2019-10-19 NOTE — Progress Notes (Signed)
    Procedures performed today:    Procedure: Real-time Ultrasound Guided injection of the left subacromial bursa Device: Samsung HS60  Verbal informed consent obtained.  Time-out conducted.  Noted no overlying erythema, induration, or other signs of local infection.  Skin prepped in a sterile fashion.  Local anesthesia: Topical Ethyl chloride.  With sterile technique and under real time ultrasound guidance: 1 cc Kenalog 40, 1 cc lidocaine, 1 cc bupivacaine injected easily Completed without difficulty  Pain immediately resolved suggesting accurate placement of the medication.  Advised to call if fevers/chills, erythema, induration, drainage, or persistent bleeding.  Images permanently stored and available for review in the ultrasound unit.  Impression: Technically successful ultrasound guided injection.  Independent interpretation of notes and tests performed by another provider:   None.  Impression and Recommendations:    Rotator cuff tendinitis, left Joffrey does need rotator cuff repair, he is in the beginning of wedding season so this will have to wait until after November or so. I did a subacromial injection today. Adding meloxicam which will help with his hand osteoarthritis as well.  Erectile dysfunction Calling in low dose sildenafil with a discount coupon.    ___________________________________________ [name]. [name], M.D., ABFM., CAQSM. Primary Care and Sports Medicine [facility]  Adjunct Instructor of Family Medicine  [employer]

## 2019-10-19 NOTE — Addendum Note (Signed)
Addended by: Monica Becton on: 10/19/2019 02:29 PM   Modules accepted: Orders

## 2019-10-19 NOTE — Assessment & Plan Note (Signed)
Thomas Reilly does need rotator cuff repair, he is in the beginning of wedding season so this will have to wait until after November or so. I did a subacromial injection today. Adding meloxicam which will help with his hand osteoarthritis as well.

## 2019-12-10 ENCOUNTER — Other Ambulatory Visit: Payer: Self-pay | Admitting: Sports Medicine

## 2019-12-10 DIAGNOSIS — E781 Pure hyperglyceridemia: Secondary | ICD-10-CM

## 2020-01-14 ENCOUNTER — Other Ambulatory Visit: Payer: Self-pay | Admitting: Sports Medicine

## 2020-01-14 DIAGNOSIS — F39 Unspecified mood [affective] disorder: Secondary | ICD-10-CM

## 2020-02-17 ENCOUNTER — Other Ambulatory Visit: Payer: Self-pay | Admitting: Sports Medicine

## 2020-02-17 DIAGNOSIS — I10 Essential (primary) hypertension: Secondary | ICD-10-CM

## 2020-04-14 ENCOUNTER — Other Ambulatory Visit: Payer: Self-pay | Admitting: Sports Medicine

## 2020-04-14 DIAGNOSIS — E781 Pure hyperglyceridemia: Secondary | ICD-10-CM

## 2020-04-16 IMAGING — MR MRI OF THE LEFT SHOULDER WITHOUT CONTRAST
5 series · 40 of 40 positions shown · non-contrast
Comparison: None.

CLINICAL DATA: Left shoulder pain and limited range of motion for 2
years. No known injury.

EXAM:
MRI OF THE LEFT SHOULDER WITHOUT CONTRAST
TECHNIQUE: Multiplanar, multisequence MR imaging of the shoulder was performed.
No intravenous contrast was administered.

[Series 7: PD fat-sat · oblique · 4.0mm · 0.59mm/px · 7 of 19 slices shown (1 of 2)]
[im 1/19]
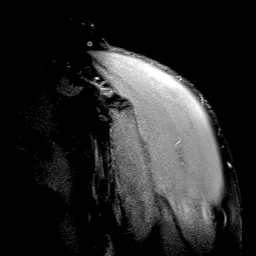
[im 4/19]
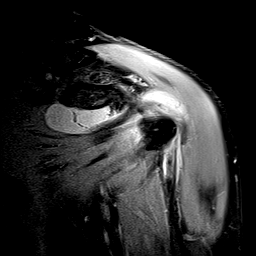
[im 7/19]
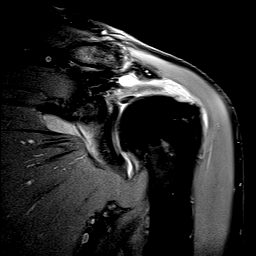
[im 10/19]
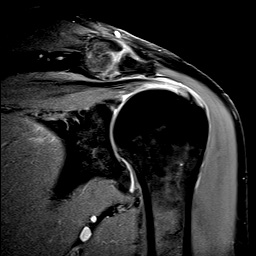
[im 13/19]
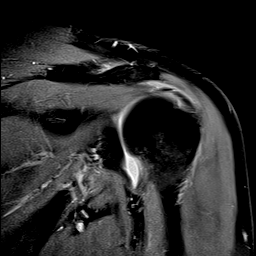
[im 16/19]
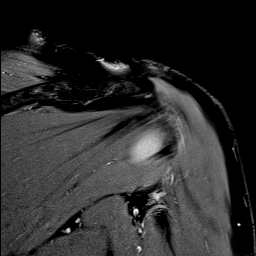
[im 19/19]
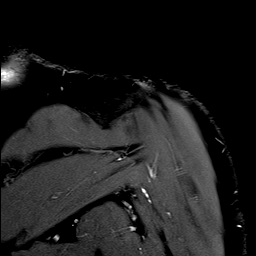

[Series 8: T2 fat-sat · oblique · 4.0mm · 0.59mm/px · 7 of 19 slices shown (1 of 2)]
[im 1/19]
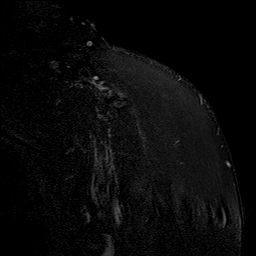
[im 4/19]
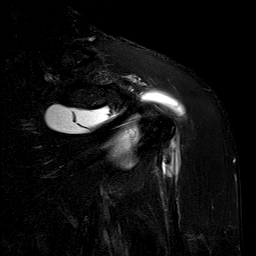
[im 7/19]
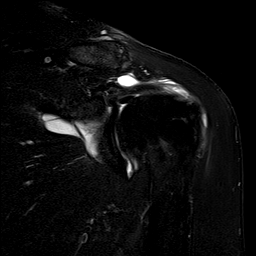
[im 10/19]
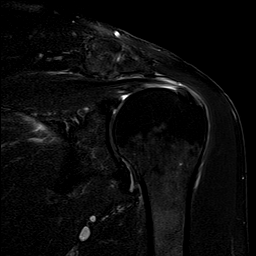
[im 13/19]
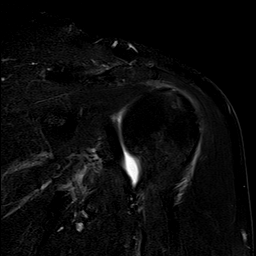
[im 16/19]
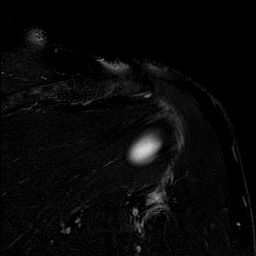
[im 19/19]
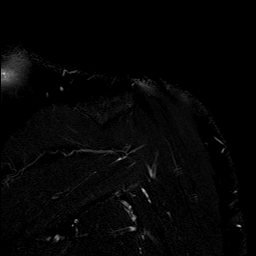

[Series 9: T1 · oblique · 4.0mm · 0.59mm/px · 9 of 27 slices shown]
[im 1/27]
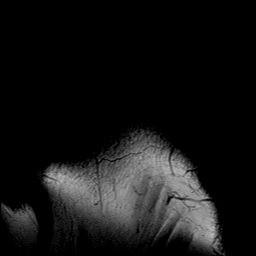
[im 4/27]
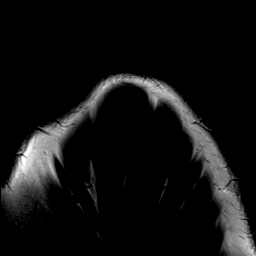
[im 7/27]
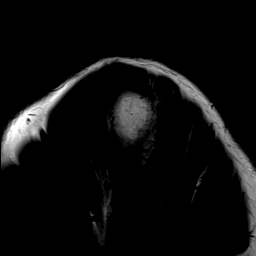
[im 10/27]
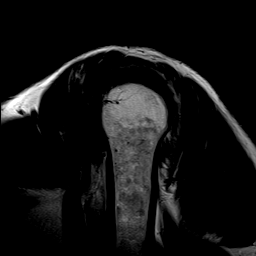
[im 14/27]
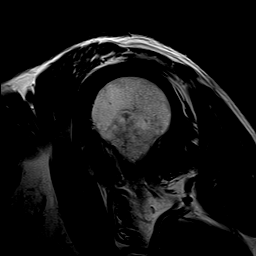
[im 17/27]
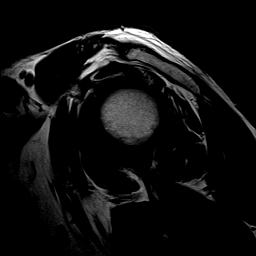
[im 20/27]
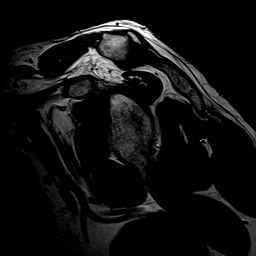
[im 23/27]
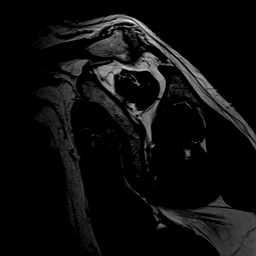
[im 27/27]
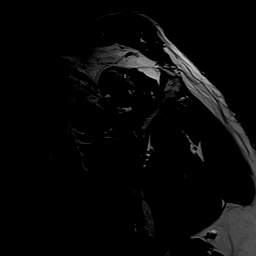

[Series 10: T2 fat-sat · oblique · 4.0mm · 0.59mm/px · 9 of 27 slices shown (2 of 2)]
[im 1/27]
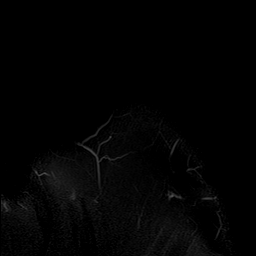
[im 4/27]
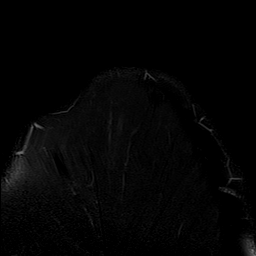
[im 7/27]
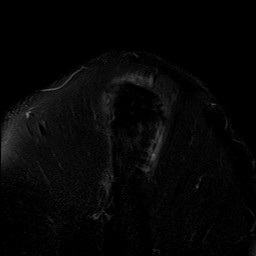
[im 10/27]
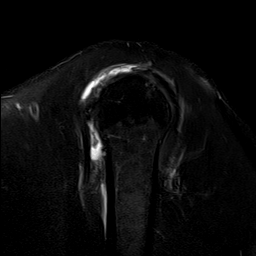
[im 14/27]
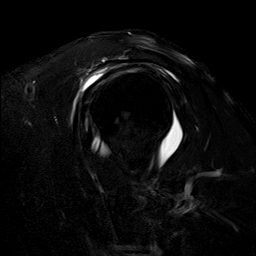
[im 17/27]
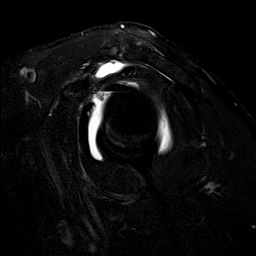
[im 20/27]
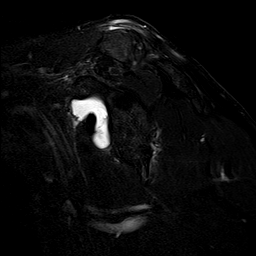
[im 23/27]
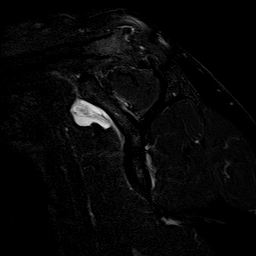
[im 27/27]
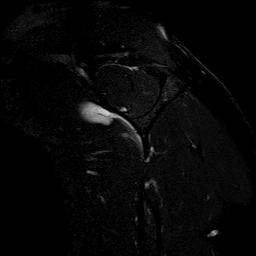

[Series 11: PD fat-sat · axial · 4.0mm · 0.59mm/px · z∈[-61,+33]mm · 8 of 24 slices shown (2 of 2)]
[im 1/24]
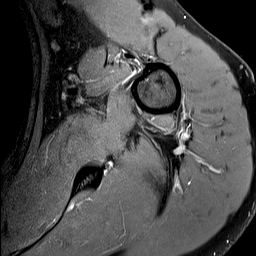
[im 4/24]
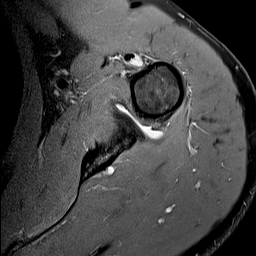
[im 7/24]
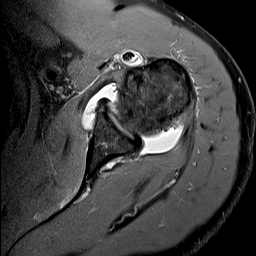
[im 10/24]
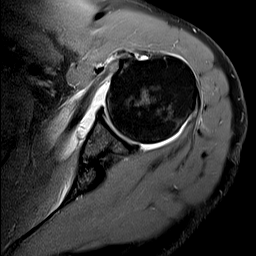
[im 14/24]
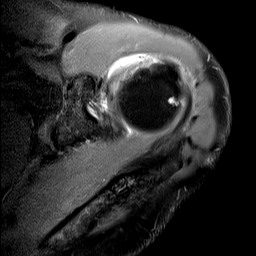
[im 17/24]
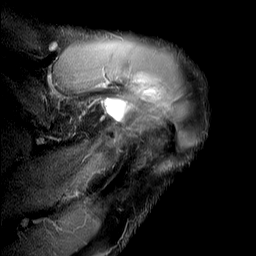
[im 20/24]
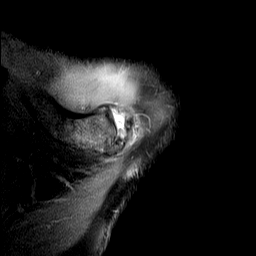
[im 24/24]
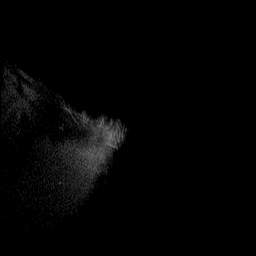

[40 of 40 positions shown; findings below may reference images not displayed]

FINDINGS: Rotator cuff: The patient has a complete supraspinatus tendon tear
approximately 1 cm medial to the tendon insertion on the greater
tuberosity. Gap between tendon fragments is 1-2 cm. The rotator cuff
is otherwise intact.

Muscles:  No atrophy or focal lesion.

Biceps long head: Intact. Tendinopathy of the intra-articular
segment is noted.

Acromioclavicular Joint: Moderately severe osteoarthritis is
present. Type 2 acromion. There is fluid in the
subacromial/subdeltoid bursa.

Glenohumeral Joint: Negative.

Labrum:  Intact.

Bones:  No fracture or worrisome lesion.

Other: None.
IMPRESSION: Rotator cuff tendinopathy with a complete supraspinatus tendon tear.
The tear is approximately 1 cm medial to the greater tuberosity with
a gap between tendon fragments of 1-2 cm. No atrophy.

Tendinopathy of the intra-articular long head of biceps without
tear.

Bulky acromioclavicular osteoarthritis.

Small volume of subacromial/subdeltoid fluid compatible with
bursitis.

## 2020-06-04 ENCOUNTER — Other Ambulatory Visit: Payer: Self-pay | Admitting: Sports Medicine

## 2020-06-04 DIAGNOSIS — E781 Pure hyperglyceridemia: Secondary | ICD-10-CM

## 2020-07-09 ENCOUNTER — Other Ambulatory Visit: Payer: Self-pay | Admitting: Sports Medicine

## 2020-07-09 DIAGNOSIS — F39 Unspecified mood [affective] disorder: Secondary | ICD-10-CM

## 2020-09-01 ENCOUNTER — Other Ambulatory Visit: Payer: Self-pay | Admitting: Sports Medicine

## 2020-09-01 DIAGNOSIS — I10 Essential (primary) hypertension: Secondary | ICD-10-CM

## 2020-10-09 ENCOUNTER — Other Ambulatory Visit: Payer: Self-pay | Admitting: Sports Medicine

## 2020-10-09 DIAGNOSIS — M7582 Other shoulder lesions, left shoulder: Secondary | ICD-10-CM

## 2020-10-10 ENCOUNTER — Other Ambulatory Visit: Payer: Self-pay | Admitting: Sports Medicine

## 2020-10-10 DIAGNOSIS — E781 Pure hyperglyceridemia: Secondary | ICD-10-CM

## 2020-12-01 ENCOUNTER — Other Ambulatory Visit: Payer: Self-pay | Admitting: Sports Medicine

## 2020-12-01 DIAGNOSIS — E781 Pure hyperglyceridemia: Secondary | ICD-10-CM

## 2020-12-23 ENCOUNTER — Other Ambulatory Visit: Payer: Self-pay | Admitting: Sports Medicine

## 2020-12-23 DIAGNOSIS — E781 Pure hyperglyceridemia: Secondary | ICD-10-CM

## 2021-01-04 ENCOUNTER — Other Ambulatory Visit: Payer: Self-pay | Admitting: Sports Medicine

## 2021-01-04 DIAGNOSIS — F39 Unspecified mood [affective] disorder: Secondary | ICD-10-CM

## 2021-01-24 ENCOUNTER — Encounter: Payer: Self-pay | Admitting: Sports Medicine

## 2021-01-24 ENCOUNTER — Other Ambulatory Visit: Payer: Self-pay

## 2021-01-24 ENCOUNTER — Ambulatory Visit (INDEPENDENT_AMBULATORY_CARE_PROVIDER_SITE_OTHER): Payer: No Typology Code available for payment source | Admitting: Sports Medicine

## 2021-01-24 VITALS — BP 152/70 | HR 56 | Ht 69.0 in | Wt 207.0 lb

## 2021-01-24 DIAGNOSIS — I10 Essential (primary) hypertension: Secondary | ICD-10-CM | POA: Diagnosis not present

## 2021-01-24 DIAGNOSIS — E781 Pure hyperglyceridemia: Secondary | ICD-10-CM

## 2021-01-24 DIAGNOSIS — Z23 Encounter for immunization: Secondary | ICD-10-CM | POA: Diagnosis not present

## 2021-01-24 DIAGNOSIS — Z0000 Encounter for general adult medical examination without abnormal findings: Secondary | ICD-10-CM

## 2021-01-24 DIAGNOSIS — F39 Unspecified mood [affective] disorder: Secondary | ICD-10-CM

## 2021-01-24 DIAGNOSIS — N529 Male erectile dysfunction, unspecified: Secondary | ICD-10-CM | POA: Diagnosis not present

## 2021-01-24 DIAGNOSIS — R011 Cardiac murmur, unspecified: Secondary | ICD-10-CM | POA: Diagnosis not present

## 2021-01-24 DIAGNOSIS — M7582 Other shoulder lesions, left shoulder: Secondary | ICD-10-CM

## 2021-01-24 LAB — COMPREHENSIVE METABOLIC PANEL
AG Ratio: 2 (calc) (ref 1.0–2.5)
ALT: 39 U/L (ref 9–46)
AST: 29 U/L (ref 10–35)
Albumin: 4.3 g/dL (ref 3.6–5.1)
Alkaline phosphatase (APISO): 32 U/L — ABNORMAL LOW (ref 35–144)
BUN: 16 mg/dL (ref 7–25)
CO2: 26 mmol/L (ref 20–32)
Calcium: 9.7 mg/dL (ref 8.6–10.3)
Chloride: 107 mmol/L (ref 98–110)
Creat: 0.82 mg/dL (ref 0.70–1.25)
Globulin: 2.2 g/dL (calc) (ref 1.9–3.7)
Glucose, Bld: 104 mg/dL — ABNORMAL HIGH (ref 65–99)
Potassium: 4.8 mmol/L (ref 3.5–5.3)
Sodium: 140 mmol/L (ref 135–146)
Total Bilirubin: 0.5 mg/dL (ref 0.2–1.2)
Total Protein: 6.5 g/dL (ref 6.1–8.1)

## 2021-01-24 LAB — CBC
HCT: 45.3 % (ref 38.5–50.0)
Hemoglobin: 15.5 g/dL (ref 13.2–17.1)
MCH: 30.5 pg (ref 27.0–33.0)
MCHC: 34.2 g/dL (ref 32.0–36.0)
MCV: 89 fL (ref 80.0–100.0)
MPV: 11.3 fL (ref 7.5–12.5)
Platelets: 217 10*3/uL (ref 140–400)
RBC: 5.09 10*6/uL (ref 4.20–5.80)
RDW: 12.9 % (ref 11.0–15.0)
WBC: 6.6 10*3/uL (ref 3.8–10.8)

## 2021-01-24 LAB — LIPID PANEL
Cholesterol: 140 mg/dL (ref <200)
HDL: 50 mg/dL (ref >=40)
LDL Cholesterol (Calc): 73 mg/dL (calc)
Non-HDL Cholesterol (Calc): 90 mg/dL (calc) (ref <130)
Total CHOL/HDL Ratio: 2.8 (calc) (ref <5.0)
Triglycerides: 88 mg/dL (ref <150)

## 2021-01-24 LAB — TSH: TSH: 1.16 mIU/L (ref 0.40–4.50)

## 2021-01-24 MED ORDER — SERTRALINE HCL 25 MG PO TABS: 25.0000 mg | ORAL_TABLET | Freq: Every day | ORAL | 3 refills | Status: DC

## 2021-01-24 MED ORDER — TADALAFIL 5 MG PO TABS: 5.0000 mg | ORAL_TABLET | Freq: Every day | ORAL | 11 refills | Status: DC

## 2021-01-24 MED ORDER — CHLORTHALIDONE 25 MG PO TABS: 12.5000 mg | ORAL_TABLET | Freq: Every day | ORAL | 3 refills | Status: DC

## 2021-01-24 MED ORDER — MELOXICAM 15 MG PO TABS: 15.0000 mg | ORAL_TABLET | Freq: Every day | ORAL | 3 refills | Status: DC | PRN

## 2021-01-24 MED ORDER — ATORVASTATIN CALCIUM 40 MG PO TABS: 1.0000 | ORAL_TABLET | Freq: Every day | ORAL | 3 refills | Status: DC

## 2021-01-24 MED ORDER — FENOFIBRATE 160 MG PO TABS: 160.0000 mg | ORAL_TABLET | Freq: Every day | ORAL | 3 refills | Status: DC

## 2021-01-24 MED ORDER — LISINOPRIL 40 MG PO TABS: 40.0000 mg | ORAL_TABLET | Freq: Every day | ORAL | 3 refills | Status: DC

## 2021-01-24 MED ORDER — CHLORTHALIDONE 15 MG PO TABS: 15.0000 mg | ORAL_TABLET | Freq: Every day | ORAL | 3 refills | Status: DC

## 2021-01-24 NOTE — Assessment & Plan Note (Signed)
Switching to Cialis, he is getting flushing with sildenafil, we will send it to [facility] with good Rx.

## 2021-01-24 NOTE — Addendum Note (Signed)
Addended by: Monica Becton on: 01/24/2021 05:30 PM   Modules accepted: Orders

## 2021-01-24 NOTE — Assessment & Plan Note (Addendum)
Unfortunately blood pressure continues to be elevated on lisinopril 40 mg without diuretic. We will continue lisinopril 40 and add chlorthalidone. He needs to come back in a nurse visit after 2 weeks of chlorthalidone.  Update: Chlorthalidone 15 not covered, switching to 12.5 mg (one half 25 mg tablet).

## 2021-01-24 NOTE — Assessment & Plan Note (Signed)
Faint 1/6 systolic murmur through the precordium, likely aortic stenosis, no symptoms, adding echocardiogram.

## 2021-01-24 NOTE — Addendum Note (Signed)
Addended by: Carolin Coy on: 01/24/2021 10:50 AM   Modules accepted: Orders

## 2021-01-24 NOTE — Progress Notes (Addendum)
Subjective:    CC: Annual Physical Exam  HPI:  This patient is here for their annual physical  I reviewed the past medical history, family history, social history, surgical history, and allergies today and no changes were needed.  Please see the problem list section below in epic for further details.  Past Medical History: Past Medical History:  Diagnosis Date   Hypertension    Past Surgical History: Past Surgical History:  Procedure Laterality Date   BACK SURGERY     HERNIA REPAIR     TONSILLECTOMY     Social History: Social History   Socioeconomic History   Marital status: Married    Spouse name: Not on file   Number of children: Not on file   Years of education: Not on file   Highest education level: Not on file  Occupational History   Occupation: Solicitor  Tobacco Use   Smoking status: Former    Pack years: 0.00    Types: Cigarettes    Quit date: 08/05/1997    Years since quitting: 23.4   Smokeless tobacco: Never  Substance and Sexual Activity   Alcohol use: Yes    Alcohol/week: 14.0 standard drinks    Types: 14 Standard drinks or equivalent per week   Drug use: No   Sexual activity: Yes    Partners: Female  Other Topics Concern   Not on file  Social History Narrative   Not on file   Social Determinants of Health   Financial Resource Strain: Not on file  Food Insecurity: Not on file  Transportation Needs: Not on file  Physical Activity: Not on file  Stress: Not on file  Social Connections: Not on file   Family History: Family History  Problem Relation Age of Onset   Hypertension Father    Cancer Father    Stroke Maternal Grandfather    Allergies: No Known Allergies Medications: See med rec.  Review of Systems: No headache, visual changes, nausea, vomiting, diarrhea, constipation, dizziness, abdominal pain, skin rash, fevers, chills, night sweats, swollen lymph nodes, weight loss, chest pain, body aches, joint swelling, muscle aches,  shortness of breath, mood changes, visual or auditory hallucinations.  Objective:    General: Well Developed, well nourished, and in no acute distress.  Neuro: Alert and oriented x3, extra-ocular muscles intact, sensation grossly intact. Cranial nerves II through XII are intact, motor, sensory, and coordinative functions are all intact. HEENT: Normocephalic, atraumatic, pupils equal round reactive to light, neck supple, no masses, no lymphadenopathy, thyroid nonpalpable. Oropharynx, nasopharynx, external ear canals are unremarkable. Skin: Warm and dry, no rashes noted.  Cardiac: Regular rate and rhythm, no rubs or gallops, 1/6 systolic murmur across the precordium. Respiratory: Clear to auscultation bilaterally. Not using accessory muscles, speaking in full sentences.  Abdominal: Soft, nontender, nondistended, positive bowel sounds, no masses, no organomegaly.  Musculoskeletal: Shoulder, elbow, wrist, hip, knee, ankle stable, and with full range of motion.  Impression and Recommendations:    The patient was counselled, risk factors were discussed, anticipatory guidance given.  Annual physical exam Annual physical as above, had a colonoscopy about 2 years ago with a 5-year follow-up. He is interested in Shingrix today, he will return in 2 to 6 months for shot #2. Checking routine labs.   Erectile dysfunction Switching to Cialis, he is getting flushing with sildenafil, we will send it to [facility] with good Rx.  Systolic murmur Faint 1/6 systolic murmur through the precordium, likely aortic stenosis, no symptoms, adding echocardiogram.  Essential hypertension, benign Unfortunately blood pressure continues to be elevated on lisinopril 40 mg without diuretic. We will continue lisinopril 40 and add chlorthalidone. He needs to come back in a nurse visit after 2 weeks of chlorthalidone.  Update: Chlorthalidone 15 not covered, switching to 12.5 mg (one half 25 mg  tablet).   ___________________________________________ [name]. [name], M.D., ABFM., CAQSM. Primary Care and Sports Medicine  [facility]  Adjunct Professor of Family Medicine  [employer]

## 2021-01-24 NOTE — Assessment & Plan Note (Signed)
Annual physical as above, had a colonoscopy about 2 years ago with a 5-year follow-up. He is interested in Shingrix today, he will return in 2 to 6 months for shot #2. Checking routine labs.

## 2021-02-06 ENCOUNTER — Other Ambulatory Visit: Payer: Self-pay

## 2021-02-06 ENCOUNTER — Ambulatory Visit (HOSPITAL_COMMUNITY)
Admission: RE | Admit: 2021-02-06 | Discharge: 2021-02-06 | Disposition: A | Discharge status: Home / Self Care | Payer: No Typology Code available for payment source | Source: Ambulatory Visit | Attending: Sports Medicine | Admitting: Sports Medicine

## 2021-02-06 DIAGNOSIS — I10 Essential (primary) hypertension: Secondary | ICD-10-CM | POA: Insufficient documentation

## 2021-02-06 DIAGNOSIS — R011 Cardiac murmur, unspecified: Secondary | ICD-10-CM | POA: Diagnosis not present

## 2021-02-06 LAB — ECHOCARDIOGRAM COMPLETE
Area-P 1/2: 3.88 cm2
S' Lateral: 2.4 cm

## 2021-02-06 NOTE — Progress Notes (Signed)
\   Echocardiogram 2D Echocardiogram with strain and 3D has been performed.  Leta Jungling M 02/06/2021, 3:36 PM

## 2021-02-07 ENCOUNTER — Ambulatory Visit: Payer: No Typology Code available for payment source

## 2021-02-14 ENCOUNTER — Other Ambulatory Visit: Payer: Self-pay

## 2021-02-14 ENCOUNTER — Ambulatory Visit (INDEPENDENT_AMBULATORY_CARE_PROVIDER_SITE_OTHER): Payer: No Typology Code available for payment source | Admitting: Family Medicine

## 2021-02-14 VITALS — BP 126/72 | HR 59 | Resp 20 | Ht 69.0 in | Wt 207.0 lb

## 2021-02-14 DIAGNOSIS — I10 Essential (primary) hypertension: Secondary | ICD-10-CM

## 2021-02-14 NOTE — Progress Notes (Signed)
First BP 144/66, after sitting for 10 minutes BP is 126/72.

## 2021-02-14 NOTE — Progress Notes (Signed)
Established Patient Office Visit  Subjective:  Patient ID: Thomas Reilly, male    DOB: 1955/08/23  Age: 65 y.o. MRN: [id number]  CC:  Chief Complaint  Patient presents with   Blood Pressure Check    HPI Thomas Reilly presents for a BP check. Pt taking medications as prescribed. First BP 144/66, after sitting for 10 minutes BP is 126/72.  Past Medical History:  Diagnosis Date   Hypertension     Past Surgical History:  Procedure Laterality Date   BACK SURGERY     HERNIA REPAIR     TONSILLECTOMY      Family History  Problem Relation Age of Onset   Hypertension Father    Cancer Father    Stroke Maternal Grandfather     Social History   Socioeconomic History   Marital status: Married    Spouse name: Not on file   Number of children: Not on file   Years of education: Not on file   Highest education level: Not on file  Occupational History   Occupation: Solicitor  Tobacco Use   Smoking status: Former    Pack years: 0.00    Types: Cigarettes    Quit date: 08/05/1997    Years since quitting: 23.5   Smokeless tobacco: Never  Substance and Sexual Activity   Alcohol use: Yes    Alcohol/week: 14.0 standard drinks    Types: 14 Standard drinks or equivalent per week   Drug use: No   Sexual activity: Yes    Partners: Female  Other Topics Concern   Not on file  Social History Narrative   Not on file   Social Determinants of Health   Financial Resource Strain: Not on file  Food Insecurity: Not on file  Transportation Needs: Not on file  Physical Activity: Not on file  Stress: Not on file  Social Connections: Not on file  Intimate Partner Violence: Not on file    Outpatient Medications Prior to Visit  Medication Sig Dispense Refill   atorvastatin (LIPITOR) 40 MG tablet Take 1 tablet (40 mg total) by mouth daily. 90 tablet 3   chlorthalidone (HYGROTON) 25 MG tablet Take 0.5 tablets (12.5 mg total) by mouth daily. 30 tablet 3   Cyanocobalamin (VITAMIN  B 12 PO) Take by mouth.     fenofibrate 160 MG tablet Take 1 tablet (160 mg total) by mouth daily. 90 tablet 3   Glucosamine HCl (GLUCOSAMINE PO) Take by mouth.     lisinopril (ZESTRIL) 40 MG tablet Take 1 tablet (40 mg total) by mouth daily. 90 tablet 3   meloxicam (MOBIC) 15 MG tablet Take 1 tablet (15 mg total) by mouth daily as needed for pain. 90 tablet 3   Omega-3 Fatty Acids (FISH OIL PO) Take by mouth.     sertraline (ZOLOFT) 25 MG tablet Take 1 tablet (25 mg total) by mouth daily. 90 tablet 3   tadalafil (CIALIS) 5 MG tablet Take 1 tablet (5 mg total) by mouth daily. 30 tablet 11   triamcinolone ointment (KENALOG) 0.5 % Apply 1 application topically 2 (two) times daily. To affected area, avoid eyes and face 30 g 3   No facility-administered medications prior to visit.    No Known Allergies  ROS Review of Systems    Objective:    Physical Exam  BP 126/72 (BP Location: Left Arm, Patient Position: Sitting, Cuff Size: Large)   Pulse (!) 59   Resp 20   Ht 5\' 9"  (1.753 m)  Wt 207 lb (93.9 kg)   SpO2 99%   BMI 30.57 kg/m  Wt Readings from Last 3 Encounters:  02/14/21 207 lb (93.9 kg)  01/24/21 207 lb (93.9 kg)  02/24/19 203 lb (92.1 kg)     Health Maintenance Due  Topic Date Due   COVID-19 Vaccine (1) Never done   Pneumococcal Vaccine 56-50 Years old (1 - PCV) Never done    There are no preventive care reminders to display for this patient.  Lab Results  Component Value Date   TSH 1.16 01/24/2021   Lab Results  Component Value Date   WBC 6.6 01/24/2021   HGB 15.5 01/24/2021   HCT 45.3 01/24/2021   MCV 89.0 01/24/2021   PLT 217 01/24/2021   Lab Results  Component Value Date   NA 140 01/24/2021   K 4.8 01/24/2021   CO2 26 01/24/2021   GLUCOSE 104 (H) 01/24/2021   BUN 16 01/24/2021   CREATININE 0.82 01/24/2021   BILITOT 0.5 01/24/2021   ALKPHOS 34 (L) 01/08/2017   AST 29 01/24/2021   ALT 39 01/24/2021   PROT 6.5 01/24/2021   ALBUMIN 4.4 01/08/2017    CALCIUM 9.7 01/24/2021   Lab Results  Component Value Date   CHOL 140 01/24/2021   Lab Results  Component Value Date   HDL 50 01/24/2021   Lab Results  Component Value Date   LDLCALC 73 01/24/2021   Lab Results  Component Value Date   TRIG 88 01/24/2021   Lab Results  Component Value Date   CHOLHDL 2.8 01/24/2021   Lab Results  Component Value Date   HGBA1C 5.6 03/04/2018      Assessment & Plan:  First BP 144/66, after sitting for 10 minutes BP is 126/72. Problem List Items Addressed This Visit       Cardiovascular and Mediastinum   Essential hypertension, benign - Primary    No orders of the defined types were placed in this encounter.   Follow-up: No follow-ups on file.    Kathrynn Speed, CMA

## 2021-02-14 NOTE — Progress Notes (Signed)
Medical screening examination/treatment was performed by qualified clinical staff member and as supervising physician I was immediately available for consultation/collaboration. I have reviewed documentation and agree with assessment and plan.  Mayerly Kaman, DO  

## 2021-02-28 ENCOUNTER — Other Ambulatory Visit: Payer: Self-pay | Admitting: Sports Medicine

## 2021-02-28 DIAGNOSIS — I10 Essential (primary) hypertension: Secondary | ICD-10-CM

## 2021-04-10 ENCOUNTER — Telehealth: Payer: Self-pay

## 2021-04-10 NOTE — Telephone Encounter (Signed)
Sent the following mychart msg to patient and it was returned ans unread:   On 01/24/21 you had your first Shingrix vaccine. You are due for your second one as of August 22nd or after. Please call the office at [phone] to schedule at nurse visit for this.   Attempted to reach patient by phone. Phone rang 3 times and disconnected. Will try to reach patient at a later time.

## 2021-04-11 NOTE — Telephone Encounter (Signed)
Called the home number and left a msg with the wife to have patient read his mychart msgs.

## 2021-04-19 ENCOUNTER — Other Ambulatory Visit: Payer: Self-pay | Admitting: Sports Medicine

## 2021-04-19 DIAGNOSIS — I10 Essential (primary) hypertension: Secondary | ICD-10-CM

## 2021-04-19 MED ORDER — CHLORTHALIDONE 25 MG PO TABS: 12.5000 mg | ORAL_TABLET | Freq: Every day | ORAL | 3 refills | Status: DC

## 2021-05-11 ENCOUNTER — Ambulatory Visit (INDEPENDENT_AMBULATORY_CARE_PROVIDER_SITE_OTHER): Payer: No Typology Code available for payment source | Admitting: Medical-Surgical

## 2021-05-11 VITALS — Temp 98.7°F

## 2021-05-11 DIAGNOSIS — Z23 Encounter for immunization: Secondary | ICD-10-CM

## 2021-05-11 NOTE — Progress Notes (Signed)
   Subjective:    Patient ID: Thomas Reilly, male    DOB: [birth year], 65 y.o.   MRN: [id number]  HPI Patient present today for his second shingles vaccine. He is afebrile and asymptomatic therefore proceeding with vaccine. He reports having no problems with previous dose.    Review of Systems     Objective:   Physical Exam        Assessment & Plan:  Patient tolerated injection well IM in the left deltoid.

## 2021-05-25 ENCOUNTER — Encounter: Payer: Self-pay | Admitting: Sports Medicine

## 2021-05-25 ENCOUNTER — Ambulatory Visit (INDEPENDENT_AMBULATORY_CARE_PROVIDER_SITE_OTHER): Payer: No Typology Code available for payment source | Admitting: Sports Medicine

## 2021-05-25 ENCOUNTER — Other Ambulatory Visit: Payer: Self-pay

## 2021-05-25 DIAGNOSIS — K644 Residual hemorrhoidal skin tags: Secondary | ICD-10-CM

## 2021-05-25 MED ORDER — WITCH HAZEL-GLYCERIN EX PADS: 1.0000 "application " | MEDICATED_PAD | CUTANEOUS | 11 refills | Status: DC | PRN

## 2021-05-25 MED ORDER — DOCUSATE SODIUM 100 MG PO CAPS: 100.0000 mg | ORAL_CAPSULE | Freq: Two times a day (BID) | ORAL | 3 refills | Status: DC

## 2021-05-25 MED ORDER — HYDROCORTISONE ACETATE 25 MG RE SUPP: 25.0000 mg | Freq: Two times a day (BID) | RECTAL | 2 refills | Status: DC | PRN

## 2021-05-25 NOTE — Progress Notes (Signed)
    Procedures performed today:    None.  Independent interpretation of notes and tests performed by another provider:   None.  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 He has unfortunately developed a hemorrhoid, bleeding. Is doing okay today, on exam he does have a small external hemorrhoid, nontender at approximately the 7 o'clock position. We will treat conservatively, Colace twice daily, Anusol HC suppositories, which hazel wipes. Return to see me as needed.   ___________________________________________ [name]. [name], M.D., ABFM., CAQSM. Primary Care and Sports Medicine [facility]  Adjunct Instructor of Family Medicine  [employer]

## 2021-05-25 NOTE — Assessment & Plan Note (Signed)
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 He has unfortunately developed a hemorrhoid, bleeding. Is doing okay today, on exam he does have a small external hemorrhoid, nontender at approximately the 7 o'clock position. We will treat conservatively, Colace twice daily, Anusol HC suppositories, which hazel wipes. Return to see me as needed.

## 2021-05-29 ENCOUNTER — Ambulatory Visit: Payer: No Typology Code available for payment source

## 2021-06-08 ENCOUNTER — Ambulatory Visit: Payer: No Typology Code available for payment source | Admitting: Sports Medicine

## 2021-11-28 ENCOUNTER — Other Ambulatory Visit: Payer: Self-pay | Admitting: Sports Medicine

## 2021-11-28 DIAGNOSIS — F39 Unspecified mood [affective] disorder: Secondary | ICD-10-CM

## 2021-11-29 ENCOUNTER — Other Ambulatory Visit: Payer: Self-pay | Admitting: Sports Medicine

## 2021-11-29 DIAGNOSIS — E781 Pure hyperglyceridemia: Secondary | ICD-10-CM

## 2021-11-29 DIAGNOSIS — I10 Essential (primary) hypertension: Secondary | ICD-10-CM

## 2021-12-19 ENCOUNTER — Encounter: Payer: Self-pay | Admitting: Sports Medicine

## 2021-12-19 ENCOUNTER — Ambulatory Visit: Payer: 59 | Admitting: Sports Medicine

## 2021-12-19 DIAGNOSIS — J301 Allergic rhinitis due to pollen: Secondary | ICD-10-CM

## 2021-12-19 DIAGNOSIS — J309 Allergic rhinitis, unspecified: Secondary | ICD-10-CM | POA: Insufficient documentation

## 2021-12-19 MED ORDER — LORATADINE 10 MG PO TABS: 10.0000 mg | ORAL_TABLET | Freq: Every day | ORAL | 3 refills | Status: DC

## 2021-12-19 MED ORDER — PREDNISONE 50 MG PO TABS: 50.0000 mg | ORAL_TABLET | Freq: Every day | ORAL | 0 refills | Status: DC

## 2021-12-19 MED ORDER — AZELASTINE HCL 0.1 % NA SOLN: 2.0000 | Freq: Two times a day (BID) | NASAL | 1 refills | Status: DC

## 2021-12-19 NOTE — Progress Notes (Signed)
? ? ?  Procedures performed today:   ? ?None. ? ?Independent interpretation of notes and tests performed by another provider:  ? ?None. ? ?Brief History, Exam, Impression, and Recommendations:   ? ?Allergic rhinitis ?Pleasant 66 year old male, for the last month he has had nasal stuffiness, runny nose, postnasal drip. ?No cough, no sinus pain or pressure, no fevers or chills. ?I think were dealing with more of an allergic rhinitis rather than a sinus infection, adding 5 days of steroids, he is not using any antihistamines so we will add Claritin, nasal azelastine. ?If not significantly better in a week or 2 then we will consider antibiotics. ? ?Chronic process with exacerbation and pharmacologic intervention ? ?___________________________________________ ?Thomas Reilly. Thomas Reilly, M.D., ABFM., CAQSM. ?Primary Care and Sports Medicine ?[facility] ? ?Adjunct Instructor of Family Medicine  ?[employer] ?

## 2021-12-19 NOTE — Assessment & Plan Note (Signed)
Pleasant 66 year old male, for the last month he has had nasal stuffiness, runny nose, postnasal drip. ?No cough, no sinus pain or pressure, no fevers or chills. ?I think were dealing with more of an allergic rhinitis rather than a sinus infection, adding 5 days of steroids, he is not using any antihistamines so we will add Claritin, nasal azelastine. ?If not significantly better in a week or 2 then we will consider antibiotics. ?

## 2021-12-20 ENCOUNTER — Other Ambulatory Visit: Payer: Self-pay | Admitting: Sports Medicine

## 2021-12-20 DIAGNOSIS — N529 Male erectile dysfunction, unspecified: Secondary | ICD-10-CM

## 2022-01-19 ENCOUNTER — Other Ambulatory Visit: Payer: Self-pay | Admitting: Sports Medicine

## 2022-01-19 DIAGNOSIS — E781 Pure hyperglyceridemia: Secondary | ICD-10-CM

## 2022-01-25 ENCOUNTER — Ambulatory Visit (INDEPENDENT_AMBULATORY_CARE_PROVIDER_SITE_OTHER): Payer: 59 | Admitting: Sports Medicine

## 2022-01-25 ENCOUNTER — Encounter: Payer: Self-pay | Admitting: Sports Medicine

## 2022-01-25 VITALS — BP 124/78 | HR 61 | Resp 16 | Ht 69.0 in | Wt 206.0 lb

## 2022-01-25 DIAGNOSIS — N139 Obstructive and reflux uropathy, unspecified: Secondary | ICD-10-CM

## 2022-01-25 DIAGNOSIS — E669 Obesity, unspecified: Secondary | ICD-10-CM | POA: Insufficient documentation

## 2022-01-25 DIAGNOSIS — I10 Essential (primary) hypertension: Secondary | ICD-10-CM | POA: Diagnosis not present

## 2022-01-25 DIAGNOSIS — E6609 Other obesity due to excess calories: Secondary | ICD-10-CM

## 2022-01-25 DIAGNOSIS — Z0000 Encounter for general adult medical examination without abnormal findings: Secondary | ICD-10-CM | POA: Diagnosis not present

## 2022-01-25 MED ORDER — PHENTERMINE HCL 37.5 MG PO TABS: ORAL_TABLET | ORAL | 0 refills | Status: DC

## 2022-01-25 NOTE — Progress Notes (Signed)
 Subjective:    CC: Annual Physical Exam  HPI:  This patient is here for their annual physical  I reviewed the past medical history, family history, social history, surgical history, and allergies today and no changes were needed.  Please see the problem list section below in epic for further details.  Past Medical History: Past Medical History:  Diagnosis Date   Hypertension    Past Surgical History: Past Surgical History:  Procedure Laterality Date   BACK SURGERY     HERNIA REPAIR     TONSILLECTOMY     Social History: Social History   Socioeconomic History   Marital status: Married    Spouse name: Not on file   Number of children: 3   Years of education: Not on file   Highest education level: Not on file  Occupational History   Occupation: Solicitor  Tobacco Use   Smoking status: Former    Types: Cigarettes    Quit date: 08/05/1997    Years since quitting: 24.4   Smokeless tobacco: Never  Substance and Sexual Activity   Alcohol use: Yes    Alcohol/week: 21.0 standard drinks of alcohol    Types: 21 Standard drinks or equivalent per week    Comment: 3 beers a day   Drug use: No   Sexual activity: Yes    Partners: Female  Other Topics Concern   Not on file  Social History Narrative   No exercise. 1 cup of coffee a day    Social Determinants of Health   Financial Resource Strain: Not on file  Food Insecurity: Not on file  Transportation Needs: Not on file  Physical Activity: Not on file  Stress: Not on file  Social Connections: Not on file   Family History: Family History  Problem Relation Age of Onset   Hypertension Father    Cancer Father    Stroke Maternal Grandfather    Allergies: No Known Allergies Medications: See med rec.  Review of Systems: No headache, visual changes, nausea, vomiting, diarrhea, constipation, dizziness, abdominal pain, skin rash, fevers, chills, night sweats, swollen lymph nodes, weight loss, chest pain, body aches,  joint swelling, muscle aches, shortness of breath, mood changes, visual or auditory hallucinations.  Objective:    General: Well Developed, well nourished, and in no acute distress.  Neuro: Alert and oriented x3, extra-ocular muscles intact, sensation grossly intact. Cranial nerves II through XII are intact, motor, sensory, and coordinative functions are all intact. HEENT: Normocephalic, atraumatic, pupils equal round reactive to light, neck supple, no masses, no lymphadenopathy, thyroid  nonpalpable. Oropharynx, nasopharynx, external ear canals are unremarkable. Skin: Warm and dry, no rashes noted.  Cardiac: Regular rate and rhythm, no murmurs rubs or gallops.  Respiratory: Clear to auscultation bilaterally. Not using accessory muscles, speaking in full sentences.  Abdominal: Soft, nontender, nondistended, positive bowel sounds, no masses, no organomegaly.  Musculoskeletal: Shoulder, elbow, wrist, hip, knee, ankle stable, and with full range of motion.  Impression and Recommendations:    The patient was counselled, risk factors were discussed, anticipatory guidance given.  Annual physical exam Annual physical as above. Checking routine labs. Return in a year for this.  Obesity Thomas Reilly is gained a lot of weight, he is interested in some help losing, he does not have time to exercise, he has tried diet plans without success. We will go ahead and try phentermine  for 6 months, with monthly weight checks and refills. Entering the first month.   ___________________________________________ Debby PARAS. Curtis, M.D., ABFM.,  CAQSM. Primary Care and Sports Medicine [facility] [facility]  Adjunct Professor of [facility] Medicine  [employer]

## 2022-01-25 NOTE — Assessment & Plan Note (Signed)
Thomas Reilly is gained a lot of weight, he is interested in some help losing, he does not have time to exercise, he has tried diet plans without success. We will go ahead and try phentermine for 6 months, with monthly weight checks and refills. Entering the first month.

## 2022-01-25 NOTE — Assessment & Plan Note (Signed)
 Annual physical as above. Checking routine labs. Return in a year for this.

## 2022-01-28 ENCOUNTER — Telehealth: Payer: Self-pay | Admitting: Sports Medicine

## 2022-01-28 ENCOUNTER — Telehealth: Payer: Self-pay

## 2022-01-28 DIAGNOSIS — E6609 Other obesity due to excess calories: Secondary | ICD-10-CM

## 2022-01-28 LAB — CBC
HCT: 48.5 % (ref 38.5–50.0)
Hemoglobin: 16.4 g/dL (ref 13.2–17.1)
MCH: 30.5 pg (ref 27.0–33.0)
MCHC: 33.8 g/dL (ref 32.0–36.0)
MCV: 90.1 fL (ref 80.0–100.0)
MPV: 10.9 fL (ref 7.5–12.5)
Platelets: 257 10*3/uL (ref 140–400)
RBC: 5.38 10*6/uL (ref 4.20–5.80)
RDW: 12.9 % (ref 11.0–15.0)
WBC: 7.4 10*3/uL (ref 3.8–10.8)

## 2022-01-28 LAB — LIPID PANEL
Cholesterol: 152 mg/dL (ref <200)
HDL: 43 mg/dL (ref >=40)
LDL Cholesterol (Calc): 76 mg/dL (calc)
Non-HDL Cholesterol (Calc): 109 mg/dL (calc) (ref <130)
Total CHOL/HDL Ratio: 3.5 (calc) (ref <5.0)
Triglycerides: 254 mg/dL — ABNORMAL HIGH (ref <150)

## 2022-01-28 LAB — COMPLETE METABOLIC PANEL WITH GFR
AG Ratio: 1.7 (calc) (ref 1.0–2.5)
ALT: 38 U/L (ref 9–46)
AST: 34 U/L (ref 10–35)
Albumin: 4.5 g/dL (ref 3.6–5.1)
Alkaline phosphatase (APISO): 38 U/L (ref 35–144)
BUN: 12 mg/dL (ref 7–25)
CO2: 25 mmol/L (ref 20–32)
Calcium: 9.8 mg/dL (ref 8.6–10.3)
Chloride: 102 mmol/L (ref 98–110)
Creat: 0.95 mg/dL (ref 0.70–1.35)
Globulin: 2.6 g/dL (calc) (ref 1.9–3.7)
Glucose, Bld: 98 mg/dL (ref 65–99)
Potassium: 4.3 mmol/L (ref 3.5–5.3)
Sodium: 136 mmol/L (ref 135–146)
Total Bilirubin: 1.1 mg/dL (ref 0.2–1.2)
Total Protein: 7.1 g/dL (ref 6.1–8.1)
eGFR: 90 mL/min/{1.73_m2} (ref >=60)

## 2022-01-28 LAB — PSA, TOTAL AND FREE
PSA, % Free: 67 % (calc) (ref >25)
PSA, Free: 0.2 ng/mL
PSA, Total: 0.3 ng/mL (ref <=4.0)

## 2022-01-28 LAB — HEMOGLOBIN A1C
Hgb A1c MFr Bld: 5.8 % of total Hgb — ABNORMAL HIGH (ref <5.7)
Mean Plasma Glucose: 120 mg/dL
eAG (mmol/L): 6.6 mmol/L

## 2022-01-28 LAB — TSH: TSH: 1.18 mIU/L (ref 0.40–4.50)

## 2022-01-28 MED ORDER — PHENTERMINE HCL 37.5 MG PO TABS: ORAL_TABLET | ORAL | 0 refills | Status: DC

## 2022-01-28 NOTE — Telephone Encounter (Signed)
Sent!

## 2022-01-28 NOTE — Telephone Encounter (Signed)
Rx went to wrong pharmacy. It needs to go to [facility] [place].

## 2022-01-28 NOTE — Telephone Encounter (Addendum)
Spouse called. Thomas Reilly no longer uses [facility]. Please send rx for phentermine to [facility]. Also wants lab results. He no longer uses mychart.

## 2022-02-01 ENCOUNTER — Other Ambulatory Visit: Payer: Self-pay | Admitting: Sports Medicine

## 2022-02-01 DIAGNOSIS — M7582 Other shoulder lesions, left shoulder: Secondary | ICD-10-CM

## 2022-02-17 ENCOUNTER — Other Ambulatory Visit: Payer: Self-pay | Admitting: Sports Medicine

## 2022-02-17 DIAGNOSIS — F39 Unspecified mood [affective] disorder: Secondary | ICD-10-CM

## 2022-02-17 DIAGNOSIS — I10 Essential (primary) hypertension: Secondary | ICD-10-CM

## 2022-02-17 DIAGNOSIS — E781 Pure hyperglyceridemia: Secondary | ICD-10-CM

## 2022-02-27 ENCOUNTER — Ambulatory Visit: Payer: 59 | Admitting: Sports Medicine

## 2022-02-27 ENCOUNTER — Telehealth: Payer: Self-pay | Admitting: Sports Medicine

## 2022-02-27 NOTE — Telephone Encounter (Signed)
Pt called.  He wrote the wrong date on his calendar and will not be coming to his appointment.

## 2022-02-28 ENCOUNTER — Encounter: Payer: Self-pay | Admitting: Sports Medicine

## 2022-02-28 ENCOUNTER — Ambulatory Visit: Payer: 59 | Admitting: Sports Medicine

## 2022-02-28 DIAGNOSIS — E6609 Other obesity due to excess calories: Secondary | ICD-10-CM

## 2022-02-28 MED ORDER — PHENTERMINE HCL 37.5 MG PO TABS: ORAL_TABLET | ORAL | 0 refills | Status: DC

## 2022-02-28 NOTE — Assessment & Plan Note (Signed)
Thomas Reilly returns, he is a pleasant 66 year old male, helping him with weight loss, 8 pound weight loss after the first month, he did drop to a half dose phentermine as he was getting some nausea. Now that he has finished a full month of half dose phentermine I would like him to try to bump back up to a full dose. Return in a month. Entering the second month.

## 2022-02-28 NOTE — Progress Notes (Signed)
    Procedures performed today:    None.  Independent interpretation of notes and tests performed by another provider:   None.  Brief History, Exam, Impression, and Recommendations:    Obesity Legend returns, he is a pleasant 66 year old male, helping him with weight loss, 8 pound weight loss after the first month, he did drop to a half dose phentermine as he was getting some nausea. Now that he has finished a full month of half dose phentermine I would like him to try to bump back up to a full dose. Return in a month. Entering the second month.    ____________________________________________ [name]. [name], M.D., ABFM., CAQSM., AME. Primary Care and Sports Medicine [place] [facility]  Adjunct Professor of Family Medicine  [employer]  Restaurant manager, fast food

## 2022-03-01 ENCOUNTER — Ambulatory Visit: Payer: 59 | Admitting: Sports Medicine

## 2022-03-28 ENCOUNTER — Encounter: Payer: Self-pay | Admitting: Sports Medicine

## 2022-03-28 ENCOUNTER — Ambulatory Visit: Payer: 59 | Admitting: Sports Medicine

## 2022-03-28 DIAGNOSIS — E6609 Other obesity due to excess calories: Secondary | ICD-10-CM | POA: Diagnosis not present

## 2022-03-28 MED ORDER — PHENTERMINE HCL 37.5 MG PO TABS: ORAL_TABLET | ORAL | 0 refills | Status: DC

## 2022-03-28 NOTE — Progress Notes (Signed)
    Procedures performed today:    None.  Independent interpretation of notes and tests performed by another provider:   None.  Brief History, Exam, Impression, and Recommendations:    Obesity Additional 6 pound weight loss. He is still getting a bit of nausea, he is taking it on an empty stomach so I suggested taking phentermine with a bit of food in his stomach. Entering the third month.  Chronic process not at goal with pharmacologic intervention  ____________________________________________ Thomas Reilly. [name], M.D., ABFM., CAQSM., AME. Primary Care and Sports Medicine [place] [facility]  Adjunct Professor of Family Medicine  [employer] [facility] [employer]  Restaurant manager, fast food

## 2022-03-28 NOTE — Assessment & Plan Note (Signed)
Additional 6 pound weight loss. He is still getting a bit of nausea, he is taking it on an empty stomach so I suggested taking phentermine with a bit of food in his stomach. Entering the third month.

## 2022-04-02 ENCOUNTER — Other Ambulatory Visit: Payer: Self-pay | Admitting: Sports Medicine

## 2022-04-02 DIAGNOSIS — E781 Pure hyperglyceridemia: Secondary | ICD-10-CM

## 2022-04-03 ENCOUNTER — Other Ambulatory Visit: Payer: Self-pay

## 2022-04-03 DIAGNOSIS — E6609 Other obesity due to excess calories: Secondary | ICD-10-CM

## 2022-04-03 NOTE — Telephone Encounter (Signed)
Pt's spouse called stating that previous RX for phentermine was sent to the incorrect pharmacy.  Previous RX cancelled at [employer] with Enrique Sack.  Pt needs this medication sent to [facility].  Please resend.  Tiajuana Amass, CMA

## 2022-04-05 MED ORDER — PHENTERMINE HCL 37.5 MG PO TABS: ORAL_TABLET | ORAL | 0 refills | Status: DC

## 2022-04-29 ENCOUNTER — Ambulatory Visit: Payer: 59 | Admitting: Sports Medicine

## 2022-04-29 ENCOUNTER — Encounter: Payer: Self-pay | Admitting: Sports Medicine

## 2022-04-29 DIAGNOSIS — E6609 Other obesity due to excess calories: Secondary | ICD-10-CM | POA: Diagnosis not present

## 2022-04-29 DIAGNOSIS — E781 Pure hyperglyceridemia: Secondary | ICD-10-CM | POA: Diagnosis not present

## 2022-04-29 DIAGNOSIS — I10 Essential (primary) hypertension: Secondary | ICD-10-CM

## 2022-04-29 MED ORDER — PHENTERMINE HCL 37.5 MG PO TABS: ORAL_TABLET | ORAL | 0 refills | Status: DC

## 2022-04-29 NOTE — Assessment & Plan Note (Signed)
Continue current meds but with his weight loss we will go ahead and recheck his fasting lipid panel at his follow-up visit.

## 2022-04-29 NOTE — Progress Notes (Signed)
    Procedures performed today:    None.  Independent interpretation of notes and tests performed by another provider:   None.  Brief History, Exam, Impression, and Recommendations:    Obesity Additional dramatic weight loss, entering the fourth month, goal weight 175 pounds. After the next month we will likely decrease to a half tab for 2 months and then a quarter tab for a month and then stop.  Essential hypertension, benign As Sabin loses weight his blood pressure is improving, we will go ahead and discontinue chlorthalidone.   Hypertriglyceridemia Continue current meds but with his weight loss we will go ahead and recheck his fasting lipid panel at his follow-up visit.    ____________________________________________ Gwen Her. Dianah Field, M.D., ABFM., CAQSM., AME. Primary Care and Sports Medicine [place] [facility]  Adjunct Professor of [place] of [facility] of Medicine  Risk manager

## 2022-04-29 NOTE — Assessment & Plan Note (Signed)
As Thomas Reilly loses weight his blood pressure is improving, we will go ahead and discontinue chlorthalidone.

## 2022-04-29 NOTE — Assessment & Plan Note (Signed)
Additional dramatic weight loss, entering the fourth month, goal weight 175 pounds. After the next month we will likely decrease to a half tab for 2 months and then a quarter tab for a month and then stop.

## 2022-05-12 ENCOUNTER — Other Ambulatory Visit: Payer: Self-pay | Admitting: Sports Medicine

## 2022-05-12 DIAGNOSIS — N529 Male erectile dysfunction, unspecified: Secondary | ICD-10-CM

## 2022-05-29 ENCOUNTER — Ambulatory Visit: Payer: 59 | Admitting: Sports Medicine

## 2022-05-29 ENCOUNTER — Encounter: Payer: Self-pay | Admitting: Sports Medicine

## 2022-05-29 DIAGNOSIS — E6609 Other obesity due to excess calories: Secondary | ICD-10-CM

## 2022-05-29 DIAGNOSIS — E781 Pure hyperglyceridemia: Secondary | ICD-10-CM | POA: Diagnosis not present

## 2022-05-29 DIAGNOSIS — I10 Essential (primary) hypertension: Secondary | ICD-10-CM | POA: Diagnosis not present

## 2022-05-29 MED ORDER — PHENTERMINE HCL 37.5 MG PO TABS: ORAL_TABLET | ORAL | 0 refills | Status: DC

## 2022-05-29 NOTE — Assessment & Plan Note (Signed)
Thomas Reilly is on high-dose fenofibrate and atorvastatin, he has lost a lot of weight, we will go ahead and recheck his triglycerides. If still elevated we will likely add either niacin or Lovaza.

## 2022-05-29 NOTE — Assessment & Plan Note (Signed)
Has had fantastic weight loss, we have now finished 4 full months of phentermine, he is at 183 pounds, seems to have leveled off. Goal weight was 175 pounds. At this point he is happy to come off phentermine so we will decrease to a half tab for 2 months then a quarter tab for a month and then stop.

## 2022-05-29 NOTE — Assessment & Plan Note (Signed)
Discontinued chlorthalidone at the last visit, continues with lisinopril 40 daily, blood pressure has normalized.

## 2022-05-29 NOTE — Progress Notes (Signed)
    Procedures performed today:    None.  Independent interpretation of notes and tests performed by another provider:   None.  Brief History, Exam, Impression, and Recommendations:    Hypertriglyceridemia Krishawn is on high-dose fenofibrate and atorvastatin, he has lost a lot of weight, we will go ahead and recheck his triglycerides. If still elevated we will likely add either niacin or Lovaza.  Essential hypertension, benign Discontinued chlorthalidone at the last visit, continues with lisinopril 40 daily, blood pressure has normalized.  Obesity Has had fantastic weight loss, we have now finished 4 full months of phentermine, he is at 183 pounds, seems to have leveled off. Goal weight was 175 pounds. At this point he is happy to come off phentermine so we will decrease to a half tab for 2 months then a quarter tab for a month and then stop.    ____________________________________________ Gwen Her. Dianah Field, M.D., ABFM., CAQSM., AME. Primary Care and Sports Medicine [place] [facility]  Adjunct Professor of [place] of [facility] of Medicine  Risk manager

## 2022-05-30 LAB — LIPID PANEL
Cholesterol: 119 mg/dL (ref <200)
HDL: 64 mg/dL (ref >=40)
LDL Cholesterol (Calc): 41 mg/dL (calc)
Non-HDL Cholesterol (Calc): 55 mg/dL (calc) (ref <130)
Total CHOL/HDL Ratio: 1.9 (calc) (ref <5.0)
Triglycerides: 55 mg/dL (ref <150)

## 2022-05-31 ENCOUNTER — Telehealth: Payer: Self-pay | Admitting: Sports Medicine

## 2022-05-31 NOTE — Telephone Encounter (Signed)
Spoke with Stanton Kidney, she wanted to know if pt should continue his cholesterol meds. Daneil Dan to have pt continue all meds as prescribed.

## 2022-05-31 NOTE — Telephone Encounter (Signed)
Patient's wife called about medication states she been trying to get it and has left a message on machine and would like a call back

## 2022-05-31 NOTE — Telephone Encounter (Signed)
Patient's wife called about mediation didn't state which one her husband needs she left a vm yesterday on nurses vm and would like a call back please.

## 2022-08-01 ENCOUNTER — Ambulatory Visit: Payer: 59 | Admitting: Sports Medicine

## 2022-08-01 ENCOUNTER — Ambulatory Visit (INDEPENDENT_AMBULATORY_CARE_PROVIDER_SITE_OTHER): Payer: 59

## 2022-08-01 DIAGNOSIS — M7582 Other shoulder lesions, left shoulder: Secondary | ICD-10-CM

## 2022-08-01 MED ORDER — TRIAMCINOLONE ACETONIDE 40 MG/ML IJ SUSP
40.0000 mg | Freq: Once | INTRAMUSCULAR | Status: AC
  Administered 2022-08-01: 40 mg via INTRAMUSCULAR

## 2022-08-01 NOTE — Assessment & Plan Note (Addendum)
Thomas Reilly returns, he is a very pleasant 66 year old male, he does need rotator cuff repair, we did a subacromial injection for therapeutic purposes back in 2021 and he did extremely well, now having increasing discomfort, repeat left subacromial injection, if this fails we will refer him for cuff repair. Signs were mostly impingement today but if he continues to have discomfort we can certainly try a proximal biceps sheath injection considering partial tearing on ultrasound.

## 2022-08-01 NOTE — Progress Notes (Signed)
    Procedures performed today:    Procedure: Real-time Ultrasound Guided injection of the left subacromial bursa Device: Samsung HS60  Verbal informed consent obtained.  Time-out conducted.  Noted no overlying erythema, induration, or other signs of local infection.  Skin prepped in a sterile fashion.  Local anesthesia: Topical Ethyl chloride.  With sterile technique and under real time ultrasound guidance: Rotator cuff was intact, there was at least partial tearing of the long head of the biceps, 1 cc Kenalog 40, 1 cc lidocaine, 1 cc bupivacaine injected easily Completed without difficulty  Advised to call if fevers/chills, erythema, induration, drainage, or persistent bleeding.  Images permanently stored and available for review in PACS.  Impression: Technically successful ultrasound guided injection.  Independent interpretation of notes and tests performed by another provider:   None.  Brief History, Exam, Impression, and Recommendations:    Rotator cuff tendinitis, left Thomas Reilly returns, he is a very pleasant 66 year old male, he does need rotator cuff repair, we did a subacromial injection for therapeutic purposes back in 2021 and he did extremely well, now having increasing discomfort, repeat left subacromial injection, if this fails we will refer him for cuff repair. Signs were mostly impingement today but if he continues to have discomfort we can certainly try a proximal biceps sheath injection considering partial tearing on ultrasound.  Chronic process with exacerbation and pharmacologic intervention  ____________________________________________ [name]. [name], M.D., ABFM., CAQSM., AME. Primary Care and Sports Medicine [place] [facility]  Adjunct Professor of Family Medicine  [place] of [facility] of Medicine  Restaurant manager, fast food

## 2022-08-01 NOTE — Addendum Note (Signed)
Addended by: Carren Rang A on: 08/01/2022 11:51 AM   Modules accepted: Orders

## 2022-09-05 ENCOUNTER — Encounter: Payer: Self-pay | Admitting: Sports Medicine

## 2022-09-05 ENCOUNTER — Ambulatory Visit: Payer: 59 | Admitting: Sports Medicine

## 2022-09-05 ENCOUNTER — Ambulatory Visit (INDEPENDENT_AMBULATORY_CARE_PROVIDER_SITE_OTHER): Payer: 59

## 2022-09-05 VITALS — Ht 69.0 in | Wt 189.0 lb

## 2022-09-05 DIAGNOSIS — M542 Cervicalgia: Secondary | ICD-10-CM

## 2022-09-05 DIAGNOSIS — M5481 Occipital neuralgia: Secondary | ICD-10-CM

## 2022-09-05 DIAGNOSIS — M47812 Spondylosis without myelopathy or radiculopathy, cervical region: Secondary | ICD-10-CM | POA: Insufficient documentation

## 2022-09-05 DIAGNOSIS — M5030 Other cervical disc degeneration, unspecified cervical region: Secondary | ICD-10-CM | POA: Insufficient documentation

## 2022-09-05 MED ORDER — CYCLOBENZAPRINE HCL 10 MG PO TABS: 5.0000 mg | ORAL_TABLET | Freq: Every day | ORAL | 11 refills | Status: DC

## 2022-09-05 MED ORDER — PREDNISONE 50 MG PO TABS: ORAL_TABLET | ORAL | 0 refills | Status: DC

## 2022-09-05 NOTE — Assessment & Plan Note (Signed)
Pleasant 67 year old male, increasing pain right side of the neck with radiation up into the right occiput with numbness. This is likely occipital neuralgia, this can be a manifestation of cervical degenerative disc disease. Will get some neck x-rays, home conditioning, prednisone, Flexeril at night, return to see me in 6 weeks, MR if no better.

## 2022-09-05 NOTE — Progress Notes (Signed)
    Procedures performed today:    None.  Independent interpretation of notes and tests performed by another provider:   None.  Brief History, Exam, Impression, and Recommendations:    Occipital neuralgia of right side Pleasant 67 year old male, increasing pain right side of the neck with radiation up into the right occiput with numbness. This is likely occipital neuralgia, this can be a manifestation of cervical degenerative disc disease. Will get some neck x-rays, home conditioning, prednisone, Flexeril at night, return to see me in 6 weeks, MR if no better.    ____________________________________________ Gwen Her. Dianah Field, M.D., ABFM., CAQSM., AME. Primary Care and Sports Medicine [place] [facility]  Adjunct Professor of [place] of [facility] of Medicine  Risk manager

## 2022-09-19 ENCOUNTER — Ambulatory Visit: Payer: 59 | Admitting: Sports Medicine

## 2022-09-19 DIAGNOSIS — M5030 Other cervical disc degeneration, unspecified cervical region: Secondary | ICD-10-CM | POA: Diagnosis not present

## 2022-09-19 MED ORDER — CYCLOBENZAPRINE HCL 10 MG PO TABS: ORAL_TABLET | ORAL | 0 refills | Status: DC

## 2022-09-19 MED ORDER — TRAMADOL HCL 50 MG PO TABS: 50.0000 mg | ORAL_TABLET | Freq: Three times a day (TID) | ORAL | 0 refills | Status: DC | PRN

## 2022-09-19 NOTE — Progress Notes (Signed)
    Procedures performed today:    None.  Independent interpretation of notes and tests performed by another provider:   None.  Brief History, Exam, Impression, and Recommendations:    DDD (degenerative disc disease), cervical Increasing axial neck pain, right-sided, progressive weakness right upper extremity. We did a course of prednisone, he improved and then started to worsen. Adding Flexeril, tramadol, we will proceed with MRI due to the weakness. Return to see me to go over MRI results and plan epidural.  Chronic process with exacerbation and pharmacologic intervention  ____________________________________________ Gwen Her. Dianah Field, M.D., ABFM., CAQSM., AME. Primary Care and Sports Medicine [place] [facility]  Adjunct Professor of [place] of [facility] of Medicine  Risk manager

## 2022-09-19 NOTE — Assessment & Plan Note (Signed)
Increasing axial neck pain, right-sided, progressive weakness right upper extremity. We did a course of prednisone, he improved and then started to worsen. Adding Flexeril, tramadol, we will proceed with MRI due to the weakness. Return to see me to go over MRI results and plan epidural.

## 2022-09-23 ENCOUNTER — Ambulatory Visit (INDEPENDENT_AMBULATORY_CARE_PROVIDER_SITE_OTHER): Payer: 59

## 2022-09-23 DIAGNOSIS — G8929 Other chronic pain: Secondary | ICD-10-CM

## 2022-09-23 DIAGNOSIS — M542 Cervicalgia: Secondary | ICD-10-CM

## 2022-09-23 DIAGNOSIS — M5030 Other cervical disc degeneration, unspecified cervical region: Secondary | ICD-10-CM | POA: Diagnosis not present

## 2022-09-27 ENCOUNTER — Ambulatory Visit: Payer: 59 | Admitting: Sports Medicine

## 2022-09-27 DIAGNOSIS — M47812 Spondylosis without myelopathy or radiculopathy, cervical region: Secondary | ICD-10-CM | POA: Diagnosis not present

## 2022-09-27 NOTE — Assessment & Plan Note (Signed)
This is a very pleasant 67 year old male, he has chronic neck pain, he has improved considerably with conservative treatment including NSAIDs, ultimately an MRI showed very little Disc disease but predominantly a very edematous right-sided C2-C3 facet joint. I think this was likely source of the pain, he will continue NSAIDs as needed, home conditioning, and he will let me know if pain continues to the point where we would need a facet injection.

## 2022-09-27 NOTE — Progress Notes (Signed)
    Procedures performed today:    None.  Independent interpretation of notes and tests performed by another provider:   None.  Brief History, Exam, Impression, and Recommendations:    Cervical facet joint syndrome This is a very pleasant 67 year old male, he has chronic neck pain, he has improved considerably with conservative treatment including NSAIDs, ultimately an MRI showed very little Disc disease but predominantly a very edematous right-sided C2-C3 facet joint. I think this was likely source of the pain, he will continue NSAIDs as needed, home conditioning, and he will let me know if pain continues to the point where we would need a facet injection.    ____________________________________________ Gwen Her. Dianah Field, M.D., ABFM., CAQSM., AME. Primary Care and Sports Medicine [place] [facility]  Adjunct Professor of [place] of [facility] of Medicine  Risk manager

## 2022-11-16 ENCOUNTER — Other Ambulatory Visit: Payer: Self-pay | Admitting: Sports Medicine

## 2022-11-16 DIAGNOSIS — N529 Male erectile dysfunction, unspecified: Secondary | ICD-10-CM

## 2022-12-05 ENCOUNTER — Telehealth: Payer: Self-pay | Admitting: Sports Medicine

## 2023-01-02 ENCOUNTER — Other Ambulatory Visit: Payer: Self-pay | Admitting: Sports Medicine

## 2023-01-02 DIAGNOSIS — M7582 Other shoulder lesions, left shoulder: Secondary | ICD-10-CM

## 2023-01-21 ENCOUNTER — Other Ambulatory Visit: Payer: Self-pay | Admitting: Sports Medicine

## 2023-01-21 DIAGNOSIS — I10 Essential (primary) hypertension: Secondary | ICD-10-CM

## 2023-01-21 DIAGNOSIS — E781 Pure hyperglyceridemia: Secondary | ICD-10-CM

## 2023-01-21 DIAGNOSIS — F39 Unspecified mood [affective] disorder: Secondary | ICD-10-CM

## 2023-02-25 ENCOUNTER — Other Ambulatory Visit: Payer: Self-pay | Admitting: Sports Medicine

## 2023-02-25 DIAGNOSIS — E781 Pure hyperglyceridemia: Secondary | ICD-10-CM

## 2023-05-04 ENCOUNTER — Other Ambulatory Visit: Payer: Self-pay | Admitting: Sports Medicine

## 2023-05-04 DIAGNOSIS — E781 Pure hyperglyceridemia: Secondary | ICD-10-CM

## 2023-06-12 ENCOUNTER — Other Ambulatory Visit: Payer: Self-pay | Admitting: Sports Medicine

## 2023-06-12 DIAGNOSIS — N529 Male erectile dysfunction, unspecified: Secondary | ICD-10-CM

## 2023-06-24 DIAGNOSIS — M7062 Trochanteric bursitis, left hip: Secondary | ICD-10-CM | POA: Diagnosis not present

## 2023-07-09 DIAGNOSIS — H02831 Dermatochalasis of right upper eyelid: Secondary | ICD-10-CM | POA: Diagnosis not present

## 2023-07-09 DIAGNOSIS — H524 Presbyopia: Secondary | ICD-10-CM | POA: Diagnosis not present

## 2023-07-09 DIAGNOSIS — H4321 Crystalline deposits in vitreous body, right eye: Secondary | ICD-10-CM | POA: Diagnosis not present

## 2023-07-09 DIAGNOSIS — H52223 Regular astigmatism, bilateral: Secondary | ICD-10-CM | POA: Diagnosis not present

## 2023-07-09 DIAGNOSIS — H02834 Dermatochalasis of left upper eyelid: Secondary | ICD-10-CM | POA: Diagnosis not present

## 2023-07-09 DIAGNOSIS — H25813 Combined forms of age-related cataract, bilateral: Secondary | ICD-10-CM | POA: Diagnosis not present

## 2023-07-09 DIAGNOSIS — H17821 Peripheral opacity of cornea, right eye: Secondary | ICD-10-CM | POA: Diagnosis not present

## 2023-09-03 ENCOUNTER — Ambulatory Visit: Payer: Self-pay | Admitting: Sports Medicine

## 2023-09-03 NOTE — Telephone Encounter (Signed)
Copied from CRM [id number]. Topic: Clinical - Red Word Triage >> Sep 03, 2023  9:54 AM Clayton Bibles wrote: Red Word that prompted transfer to Nurse Triage: Severe pain in right leg and sometimes in left leg. No fever, no swelling. 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 Patient stated the pain has been occurring off an on for the past few months. Patient stated the pain is mostly in his right leg, but sometimes occurs in the left leg. Patient stated he his not currently experiencing pain. 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 Patient denied swelling, redness, numbness, tingling, and fever. Advised patient to be seen in the office within 2 weeks. Patient sees Dr. Karie Schwalbe as his PCP. This RN transferred the patient to the CAL for further scheduling, per instruction.   Reason for Disposition  Leg pain or muscle cramp is a chronic symptom (recurrent or ongoing AND present > 4 weeks)  Answer Assessment - Initial Assessment Questions 1. ONSET: "When did the pain start?"      A few months ago  2. LOCATION: "Where is the pain located?"      Right leg, sometimes the left leg  3. PAIN: "How bad is the pain?"    (Scale 1-10; or mild, moderate, severe)   -  MILD (1-3): doesn't interfere with normal activities    -  MODERATE (4-7): interferes with normal activities (e.g., work or school) or awakens from sleep, limping    -  SEVERE (8-10): excruciating pain, unable to do any normal activities, unable to walk     Rates pain a 0 right now, but  states the pain intensifies once he starts moving  4. WORK OR EXERCISE: "Has there been any recent work or exercise that involved this part of the body?"      Denies, working on the farm in general causes pain  5. CAUSE: "What do you think is causing the leg pain?"     Denies  6. OTHER SYMPTOMS: "Do you have any other symptoms?" (e.g., chest pain, back pain, breathing difficulty, swelling, rash, fever, numbness, weakness)     Denies swelling, denies redness, denies numbness, denies tingling, denies fever  Protocols used: Leg Pain-A-AH

## 2023-09-05 ENCOUNTER — Ambulatory Visit (INDEPENDENT_AMBULATORY_CARE_PROVIDER_SITE_OTHER): Payer: Medicare Other | Admitting: Sports Medicine

## 2023-09-05 ENCOUNTER — Ambulatory Visit: Payer: Medicare Other

## 2023-09-05 ENCOUNTER — Encounter: Payer: Self-pay | Admitting: Sports Medicine

## 2023-09-05 DIAGNOSIS — G5603 Carpal tunnel syndrome, bilateral upper limbs: Secondary | ICD-10-CM | POA: Diagnosis not present

## 2023-09-05 DIAGNOSIS — I10 Essential (primary) hypertension: Secondary | ICD-10-CM

## 2023-09-05 DIAGNOSIS — N139 Obstructive and reflux uropathy, unspecified: Secondary | ICD-10-CM | POA: Diagnosis not present

## 2023-09-05 DIAGNOSIS — M47816 Spondylosis without myelopathy or radiculopathy, lumbar region: Secondary | ICD-10-CM | POA: Diagnosis not present

## 2023-09-05 DIAGNOSIS — R7309 Other abnormal glucose: Secondary | ICD-10-CM | POA: Diagnosis not present

## 2023-09-05 DIAGNOSIS — M48061 Spinal stenosis, lumbar region without neurogenic claudication: Secondary | ICD-10-CM | POA: Diagnosis not present

## 2023-09-05 DIAGNOSIS — M79604 Pain in right leg: Secondary | ICD-10-CM | POA: Diagnosis not present

## 2023-09-05 DIAGNOSIS — M79605 Pain in left leg: Secondary | ICD-10-CM | POA: Diagnosis not present

## 2023-09-05 MED ORDER — PREDNISONE 50 MG PO TABS: ORAL_TABLET | ORAL | 0 refills | Status: DC

## 2023-09-05 NOTE — Assessment & Plan Note (Addendum)
Thomas Reilly is also complaining of bilateral hand discomfort, numbness and tingling at night. He has bilateral positive Tinel signs, we will start with nocturnal splinting, return to see me in 6 weeks, hydrodissections if not better.

## 2023-09-05 NOTE — Progress Notes (Signed)
    Procedures performed today:    None.  Independent interpretation of notes and tests performed by another provider:   None.  Brief History, Exam, Impression, and Recommendations:    Lumbar spinal stenosis Increasing axial low back pain worse with standing with radiation down both legs to the back of both thighs and calves. No red flag symptoms. Adding x-rays, 5 days of prednisone, formal PT, return to see me in 6 weeks, MR for interventional planning if not better.  Bilateral carpal tunnel syndrome [place] is also complaining of bilateral hand discomfort, numbness and tingling at night. He has bilateral positive Tinel signs, we will start with nocturnal splinting, return to see me in 6 weeks, hydrodissections if not better.    ____________________________________________ [name]. [name], M.D., ABFM., CAQSM., AME. Primary Care and Sports Medicine  [facility]  Adjunct Professor of Family Medicine  [place] of [facility] of Medicine  Restaurant manager, fast food

## 2023-09-05 NOTE — Patient Instructions (Signed)
 Marland Kitchen

## 2023-09-05 NOTE — Assessment & Plan Note (Signed)
Increasing axial low back pain worse with standing with radiation down both legs to the back of both thighs and calves. No red flag symptoms. Adding x-rays, 5 days of prednisone, formal PT, return to see me in 6 weeks, MR for interventional planning if not better.

## 2023-09-06 LAB — COMPREHENSIVE METABOLIC PANEL
ALT: 34 [IU]/L (ref 0–44)
AST: 32 [IU]/L (ref 0–40)
Albumin: 4.5 g/dL (ref 3.9–4.9)
Alkaline Phosphatase: 43 [IU]/L — ABNORMAL LOW (ref 44–121)
BUN/Creatinine Ratio: 12 (ref 10–24)
BUN: 10 mg/dL (ref 8–27)
Bilirubin Total: 0.7 mg/dL (ref 0.0–1.2)
CO2: 22 mmol/L (ref 20–29)
Calcium: 9.4 mg/dL (ref 8.6–10.2)
Chloride: 101 mmol/L (ref 96–106)
Creatinine, Ser: 0.81 mg/dL (ref 0.76–1.27)
Globulin, Total: 2 g/dL (ref 1.5–4.5)
Glucose: 98 mg/dL (ref 70–99)
Potassium: 4.6 mmol/L (ref 3.5–5.2)
Sodium: 138 mmol/L (ref 134–144)
Total Protein: 6.5 g/dL (ref 6.0–8.5)
eGFR: 98 mL/min/{1.73_m2} (ref >59)

## 2023-09-06 LAB — CBC
Hematocrit: 44.6 % (ref 37.5–51.0)
Hemoglobin: 15.3 g/dL (ref 13.0–17.7)
MCH: 31.2 pg (ref 26.6–33.0)
MCHC: 34.3 g/dL (ref 31.5–35.7)
MCV: 91 fL (ref 79–97)
Platelets: 213 10*3/uL (ref 150–450)
RBC: 4.91 x10E6/uL (ref 4.14–5.80)
RDW: 12.4 % (ref 11.6–15.4)
WBC: 6.8 10*3/uL (ref 3.4–10.8)

## 2023-09-06 LAB — PSA, TOTAL AND FREE
PSA, Free Pct: 25 %
PSA, Free: 0.15 ng/mL
Prostate Specific Ag, Serum: 0.6 ng/mL (ref 0.0–4.0)

## 2023-09-06 LAB — TSH: TSH: 0.919 u[IU]/mL (ref 0.450–4.500)

## 2023-09-06 LAB — LIPID PANEL
Chol/HDL Ratio: 2.4 {ratio} (ref 0.0–5.0)
Cholesterol, Total: 143 mg/dL (ref 100–199)
HDL: 59 mg/dL (ref >39)
LDL Chol Calc (NIH): 67 mg/dL (ref 0–99)
Triglycerides: 88 mg/dL (ref 0–149)
VLDL Cholesterol Cal: 17 mg/dL (ref 5–40)

## 2023-09-06 LAB — HEMOGLOBIN A1C
Est. average glucose Bld gHb Est-mCnc: 117 mg/dL
Hgb A1c MFr Bld: 5.7 % — ABNORMAL HIGH (ref 4.8–5.6)

## 2023-09-06 LAB — SPECIMEN STATUS REPORT

## 2023-09-09 ENCOUNTER — Encounter: Payer: Self-pay | Admitting: Sports Medicine

## 2023-09-09 ENCOUNTER — Ambulatory Visit (INDEPENDENT_AMBULATORY_CARE_PROVIDER_SITE_OTHER): Payer: Medicare Other | Admitting: Sports Medicine

## 2023-09-09 ENCOUNTER — Other Ambulatory Visit: Payer: Self-pay | Admitting: Sports Medicine

## 2023-09-09 VITALS — BP 184/91 | HR 79 | Ht 69.0 in | Wt 204.0 lb

## 2023-09-09 DIAGNOSIS — Z0000 Encounter for general adult medical examination without abnormal findings: Secondary | ICD-10-CM

## 2023-09-09 DIAGNOSIS — M7582 Other shoulder lesions, left shoulder: Secondary | ICD-10-CM

## 2023-09-09 DIAGNOSIS — I10 Essential (primary) hypertension: Secondary | ICD-10-CM | POA: Diagnosis not present

## 2023-09-09 MED ORDER — BLOOD PRESSURE CUFF MISC: 0 refills | Status: AC

## 2023-09-09 NOTE — Assessment & Plan Note (Signed)
 Blood pressure was elevated today, he is currently on lisinopril  40, he is being treated for lumbar spinal stenosis, we recently did some prednisone  about 5 days ago, pain is improved but I do expect some blood pressure elevation from the prednisone  itself, we will send a blood pressure cuff to his pharmacy and I would like him to check his home blood pressures with ambulatory monitoring over the next 2 weeks and send me a diary. If persistently elevated we may need to add amlodipine  to the regimen.

## 2023-09-09 NOTE — Progress Notes (Signed)
Subjective:    CC: Annual Physical Exam  HPI:  This patient is here for their annual physical  I reviewed the past medical history, family history, social history, surgical history, and allergies today and no changes were needed.  Please see the problem list section below in epic for further details.  Past Medical History: Past Medical History:  Diagnosis Date   Hypertension    Past Surgical History: Past Surgical History:  Procedure Laterality Date   BACK SURGERY     HERNIA REPAIR     TONSILLECTOMY     Social History: Social History   Socioeconomic History   Marital status: Married    Spouse name: Not on file   Number of children: 3   Years of education: Not on file   Highest education level: Not on file  Occupational History   Occupation: Solicitor  Tobacco Use   Smoking status: Former    Current packs/day: 0.00    Types: Cigarettes    Quit date: 08/05/1997    Years since quitting: 26.1   Smokeless tobacco: Never  Substance and Sexual Activity   Alcohol use: Yes    Alcohol/week: 21.0 standard drinks of alcohol    Types: 21 Standard drinks or equivalent per week    Comment: 3 beers a day   Drug use: No   Sexual activity: Yes    Partners: Female  Other Topics Concern   Not on file  Social History Narrative   No exercise. 1 cup of coffee a day    Social Drivers of Corporate investment banker Strain: Not on file  Food Insecurity: Not on file  Transportation Needs: Not on file  Physical Activity: Not on file  Stress: Not on file  Social Connections: Unknown (12/14/2021)   [facility]   Social Network    Social Network: Not on file   Family History: Family History  Problem Relation Age of Onset   Hypertension Father    Cancer Father    Stroke Maternal Grandfather    Allergies: No Known Allergies Medications: See med rec.  Review of Systems: No headache, visual changes, nausea, vomiting, diarrhea, constipation,  dizziness, abdominal pain, skin rash, fevers, chills, night sweats, swollen lymph nodes, weight loss, chest pain, body aches, joint swelling, muscle aches, shortness of breath, mood changes, visual or auditory hallucinations.  Objective:    General: Well Developed, well nourished, and in no acute distress.  Neuro: Alert and oriented x3, extra-ocular muscles intact, sensation grossly intact. Cranial nerves II through XII are intact, motor, sensory, and coordinative functions are all intact. HEENT: Normocephalic, atraumatic, pupils equal round reactive to light, neck supple, no masses, no lymphadenopathy, thyroid nonpalpable. Oropharynx, nasopharynx, external ear canals are unremarkable. Skin: Warm and dry, no rashes noted.  Cardiac: Regular rate and rhythm, no murmurs rubs or gallops.  Respiratory: Clear to auscultation bilaterally. Not using accessory muscles, speaking in full sentences.  Abdominal: Soft, nontender, nondistended, positive bowel sounds, no masses, no organomegaly.  Musculoskeletal: Shoulder, elbow, wrist, hip, knee, ankle stable, and with full range of motion.  Impression and Recommendations:    The patient was counselled, risk factors were discussed, anticipatory guidance given.  Annual physical exam Annual physical as above, labs were obtained previously and looked okay with the exception of mild glucose intolerance, we did discuss carbohydrate reduction and weight loss today. Declines Pneumovax and flu shot.  Essential hypertension, benign Blood pressure was elevated today, he is currently on lisinopril  40, he is being treated for lumbar spinal stenosis, we recently did some prednisone about 5 days ago, pain is improved but I do expect some blood pressure elevation from the prednisone itself, we will send a blood pressure cuff to his pharmacy and I would like him to check his home blood pressures with ambulatory monitoring over the next 2 weeks and send me a diary. If  persistently elevated we may need to add amlodipine to the regimen.  ____________________________________________ [name]. [name], M.D., ABFM., CAQSM., AME. Primary Care and Sports Medicine [place] [facility]  Adjunct Professor of Family Medicine  [place] of [facility] of Medicine  Restaurant manager, fast food

## 2023-09-09 NOTE — Assessment & Plan Note (Addendum)
Annual physical as above, labs were obtained previously and looked okay with the exception of mild glucose intolerance, we did discuss carbohydrate reduction and weight loss today. Declines Pneumovax and flu shot.

## 2023-09-12 ENCOUNTER — Other Ambulatory Visit: Payer: Self-pay

## 2023-09-12 ENCOUNTER — Ambulatory Visit: Payer: Medicare Other | Attending: Sports Medicine | Admitting: Physical Therapy

## 2023-09-12 ENCOUNTER — Encounter: Payer: Self-pay | Admitting: Physical Therapy

## 2023-09-12 DIAGNOSIS — M6281 Muscle weakness (generalized): Secondary | ICD-10-CM | POA: Insufficient documentation

## 2023-09-12 DIAGNOSIS — R29898 Other symptoms and signs involving the musculoskeletal system: Secondary | ICD-10-CM | POA: Insufficient documentation

## 2023-09-12 DIAGNOSIS — M48061 Spinal stenosis, lumbar region without neurogenic claudication: Secondary | ICD-10-CM | POA: Diagnosis present

## 2023-09-12 NOTE — Therapy (Signed)
 OUTPATIENT PHYSICAL THERAPY THORACOLUMBAR EVALUATION   Patient Name: Thomas Reilly MRN: [id number] DOB:07/01/1956, 68 y.o., male Today's Date: 09/12/2023  END OF SESSION:  PT End of Session - 09/12/23 0841     Visit Number 1    Number of Visits 16    Date for PT Re-Evaluation 11/07/23    Authorization Type [employer] Medicare    Authorization - Visit Number 1    Progress Note Due on Visit 10    PT Start Time 0800    PT Stop Time 0841    PT Time Calculation (min) 41 min    Activity Tolerance Patient tolerated treatment well    Behavior During Therapy [facility] for tasks assessed/performed             Past Medical History:  Diagnosis Date   Hypertension    Past Surgical History:  Procedure Laterality Date   BACK SURGERY     HERNIA REPAIR     TONSILLECTOMY     Patient Active Problem List   Diagnosis Date Noted   Lumbar spinal stenosis 09/05/2023   Bilateral carpal tunnel syndrome 09/05/2023   Cervical facet joint syndrome 09/05/2022   Obesity 01/25/2022   Allergic rhinitis 12/19/2021   External hemorrhoid 05/25/2021   Systolic murmur 01/24/2021   Erectile dysfunction 10/06/2018   Rotator cuff tendinitis, left 11/07/2017   History of total left hip arthroplasty 11/07/2017   Right proximal biceps tear 03/03/2017   Cubital tunnel syndrome of both upper extremities 12/11/2016   Diverticulitis of colon 10/30/2015   Mood disorder (HCC) 10/16/2015   Male hypogonadism 09/18/2015   Essential hypertension, benign 06/16/2015   Annual physical exam 06/16/2015   Seborrheic keratosis 06/16/2015   Hypertriglyceridemia 06/16/2015    PCP: Curtis  REFERRING PROVIDER: Curtis  REFERRING DIAG: spinal stenosis of lumbar region  Rationale for Evaluation and Treatment: Rehabilitation  THERAPY DIAG:  Other symptoms and signs involving the musculoskeletal system  Muscle weakness (generalized)  ONSET DATE: 06/2023  SUBJECTIVE:                                                                                                                                                                                            SUBJECTIVE STATEMENT: Pt states that for the past 3-4 months he has had pain shooting down both legs. He has slight back pain but pain is mostly down the backs of his legs. 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 Pain is better with rest. Pt did try prednisone  for 5 days which helped while he was on it but pain returned now that he is off of  meds  PERTINENT HISTORY:  Back surgery for ruptured disc approx. 45 years ago Lt hip replacement  PAIN:  Are you having pain? Yes: NPRS scale: 4-5/10 currently, 8-9/10 at worst Pain location: back of bilat LEs Pain description: sharp, stabbing Aggravating factors: bend, lift, carry Relieving factors: rest  PRECAUTIONS: None  RED FLAGS: None   WEIGHT BEARING RESTRICTIONS: No  FALLS:  Has patient fallen in last 6 months? 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  DIAGNOSTIC FINDINGS:  X ray performed on eval but not read  PATIENT SURVEYS:  Modified Oswestry 32% 16/50       SENSATION: Sharp/stabbing Rt LE, dull ache Lt LE  MUSCLE LENGTH: Hamstrings: Right 70 deg; Left 70 deg    PALPATION: No TTP with CPAs or UPAs lumbar spine, SIJ Increased mm spasticity lumbar paraspinals bilat  LUMBAR ROM:   AROM eval  Flexion 50%  Extension 75%  Right lateral flexion 50%  Left lateral flexion 50%  Right rotation 50%  Left rotation 50%   (Blank rows = not tested)   LOWER EXTREMITY MMT:    MMT Right eval Left eval  Hip flexion 4 pain 4 pain  Hip extension 4+ 4  Hip abduction 4 4  Hip adduction    Hip internal rotation    Hip external rotation    Knee  flexion    Knee extension    Ankle dorsiflexion    Ankle plantarflexion    Ankle inversion    Ankle eversion     (Blank rows = not tested)  LUMBAR SPECIAL TESTS:  Straight leg raise test: Negative    TREATMENT DATE: 09/12/23 See HEP Lumbar traction 65# max, 45# min on 60 sec off 20 sec x 10 minutes                                                                                                                                 PATIENT EDUCATION:  Education details: PT POC and goals, HEP Person educated: Patient Education method: Explanation, Demonstration, and Handouts Education comprehension: verbalized understanding and returned demonstration  HOME EXERCISE PROGRAM: Access Code: [email or web address] URL: [email or web address] Date: 09/12/2023 Prepared by: Darice Conine  Exercises - Supine Lower Trunk Rotation  - 1 x daily - 7 x weekly - 3 sets - 10 reps - Seated Hamstring Stretch  - 1 x daily - 7 x weekly - 1 sets - 3 reps - 20-30 seconds hold - Barbell Jefferson Curl  - 1 x daily - 7 x weekly - 2 sets - 10 reps  ASSESSMENT:  CLINICAL IMPRESSION: Patient is a 68 y.o. male who was seen today for physical therapy evaluation and treatment for lumbar spinal stenosis. He presents with decreased lumbar mobility, increased  pain in bilat LEs, decreased core and LE strength and decreased activity tolerance. He will benefit from skilled PT to address deficits and improve functional mobility with decreased pain.   OBJECTIVE IMPAIRMENTS: decreased activity tolerance, decreased mobility, decreased ROM, decreased strength, increased muscle spasms, and pain.   ACTIVITY LIMITATIONS: carrying, lifting, bending, sitting, and standing  PARTICIPATION LIMITATIONS: occupation and yard work  PERSONAL FACTORS: 1-2 comorbidities: history of lumbar surgery and hip replacement  are also affecting patient's functional outcome.   REHAB POTENTIAL: Good  CLINICAL DECISION MAKING:  Stable/uncomplicated  EVALUATION COMPLEXITY: Low   GOALS: Goals reviewed with patient? Yes  SHORT TERM GOALS: Target date: 10/10/2023    Pt will be independent with initial HEP Baseline: Goal status: INITIAL  2.  Pt will tolerate standing or sitting x 10 minutes with pain/symptoms <= 3/10 Baseline:  Goal status: INITIAL    LONG TERM GOALS: Target date: 11/07/2023    Pt will be independent with advanced HEP Baseline:  Goal status: INITIAL  2.  Pt will improve Modified Oswestry to <= 12% to demo improved functional mobility Baseline: 32% Goal status: INITIAL  3.  Pt will improve LE strength to 5/5 to improve ability to work without pain Baseline:  Goal status: INITIAL  4.  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  PLAN FOR NEXT SESSION: assess response to traction and HEP, lumbar mobility, core strength   Ishaaq Penna, PT 09/12/2023, 8:41 AM

## 2023-09-15 ENCOUNTER — Ambulatory Visit: Payer: Medicare Other

## 2023-09-15 DIAGNOSIS — R29898 Other symptoms and signs involving the musculoskeletal system: Secondary | ICD-10-CM | POA: Diagnosis not present

## 2023-09-15 DIAGNOSIS — M6281 Muscle weakness (generalized): Secondary | ICD-10-CM | POA: Diagnosis not present

## 2023-09-15 DIAGNOSIS — M48061 Spinal stenosis, lumbar region without neurogenic claudication: Secondary | ICD-10-CM | POA: Diagnosis not present

## 2023-09-15 NOTE — Therapy (Signed)
 OUTPATIENT PHYSICAL THERAPY THORACOLUMBAR TREATMENT   Patient Name: Thomas Reilly MRN: [id number] DOB:[birth year], 68 y.o., male Today's Date: 09/15/2023  END OF SESSION:  PT End of Session - 09/15/23 1102     Visit Number 2    Number of Visits 16    Date for PT Re-Evaluation 11/07/23    Authorization Type [employer] Medicare    Authorization - Visit Number 2    Authorization - Number of Visits 16    Progress Note Due on Visit 10    PT Start Time 1102    PT Stop Time 1145    PT Time Calculation (min) 43 min    Activity Tolerance Patient tolerated treatment well    Behavior During Therapy WFL for tasks assessed/performed             Past Medical History:  Diagnosis Date   Hypertension    Past Surgical History:  Procedure Laterality Date   BACK SURGERY     HERNIA REPAIR     TONSILLECTOMY     Patient Active Problem List   Diagnosis Date Noted   Lumbar spinal stenosis 09/05/2023   Bilateral carpal tunnel syndrome 09/05/2023   Cervical facet joint syndrome 09/05/2022   Obesity 01/25/2022   Allergic rhinitis 12/19/2021   External hemorrhoid 05/25/2021   Systolic murmur 01/24/2021   Erectile dysfunction 10/06/2018   Rotator cuff tendinitis, left 11/07/2017   History of total left hip arthroplasty 11/07/2017   Right proximal biceps tear 03/03/2017   Cubital tunnel syndrome of both upper extremities 12/11/2016   Diverticulitis of colon 10/30/2015   Mood disorder (HCC) 10/16/2015   Male hypogonadism 09/18/2015   Essential hypertension, benign 06/16/2015   Annual physical exam 06/16/2015   Seborrheic keratosis 06/16/2015   Hypertriglyceridemia 06/16/2015    PCP: Sandy Crumb  REFERRING PROVIDER: Sandy Crumb  REFERRING DIAG: spinal stenosis of lumbar region  Rationale for Evaluation and Treatment: Rehabilitation  THERAPY DIAG:  Other symptoms and signs involving the musculoskeletal system  Muscle weakness (generalized)  ONSET DATE: 06/2023  SUBJECTIVE:                                                                                                                                                                                            SUBJECTIVE STATEMENT: Patient reports feeling achy in L LE and minimal pain on R; states R side leg pain gets worse as day goes on. Patient states he did not feel much difference with traction at last session.   EVAL: Pt states that for the past 3-4 months he has had pain shooting down both legs. He has slight back pain but pain  is mostly down the backs of his legs. 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 Pain is better with rest. Pt did try prednisone  for 5 days which helped while he was on it but pain returned now that he is off of meds  PERTINENT HISTORY:  Back surgery for ruptured disc approx. 45 years ago Lt hip replacement  PAIN:  Are you having pain? Yes: NPRS scale: 4-5/10 currently, 8-9/10 at worst Pain location: back of bilat LEs Pain description: sharp, stabbing Aggravating factors: bend, lift, carry Relieving factors: rest  PRECAUTIONS: None  RED FLAGS: None   WEIGHT BEARING RESTRICTIONS: No  FALLS:  Has patient fallen in last 6 months? 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  DIAGNOSTIC FINDINGS:  X ray performed on eval but not read  PATIENT SURVEYS:  Modified Oswestry 32% 16/50       SENSATION: Sharp/stabbing Rt LE, dull ache Lt LE  MUSCLE LENGTH: Hamstrings: Right 70 deg; Left 70 deg    PALPATION: No TTP with CPAs or UPAs lumbar spine, SIJ Increased mm spasticity lumbar paraspinals bilat  LUMBAR ROM:   AROM eval  Flexion 50%  Extension 75%  Right lateral flexion 50%  Left lateral flexion 50%  Right rotation 50%  Left rotation  50%   (Blank rows = not tested)   LOWER EXTREMITY MMT:    MMT Right eval Left eval  Hip flexion 4 pain 4 pain  Hip extension 4+ 4  Hip abduction 4 4  Hip adduction    Hip internal rotation    Hip external rotation    Knee flexion    Knee extension    Ankle dorsiflexion    Ankle plantarflexion    Ankle inversion    Ankle eversion     (Blank rows = not tested)  LUMBAR SPECIAL TESTS:  Straight leg raise test: Negative    [facility] Adult PT Treatment:                                                DATE: 09/15/2023 Therapeutic Exercise: SKTC --> DKTC LTR x10 --> wide leg LTR x10 Supine sciatic nerve glide (B) Prone press up x10 Supine figure 4 stretch --> alt press down/pull up 5x10" Seated forward flexion stretch with orange PB S/L hip abd in IR x10 (B) S/L small leg circles abd x10 (B) 1/2 kneeling hip flexor stretch  1/2 kneeling lumbar decompression   TREATMENT DATE: 09/12/23 See HEP Lumbar traction 65# max, 45# min on 60 sec off 20 sec x 10 minutes  PATIENT EDUCATION:  Education details: Updated HEP Person educated: Patient Education method: Explanation, Demonstration, and Handouts Education comprehension: verbalized understanding and returned demonstration  HOME EXERCISE PROGRAM: Access Code: [email or web address] URL: [email or web address] Date: 09/15/2023 Prepared by: Sims Duck  Exercises - Supine Lower Trunk Rotation  - 1 x daily - 7 x weekly - 3 sets - 10 reps - Seated Hamstring Stretch  - 1 x daily - 7 x weekly - 1 sets - 3 reps - 20-30 seconds hold - Barbell Jefferson Curl  - 1 x daily - 7 x weekly - 2 sets - 10 reps - Supine Sciatic Nerve Glide  - 2 x daily - 7 x weekly - 1 sets - 10 reps - Supine Figure 4 Piriformis Stretch  - 2 x daily - 7 x weekly - 1 sets - 10 reps - 10-15 sec hold - Supine Dead Bug with Leg Extension   - 1 x daily - 7 x weekly - 3 sets - 10 reps - Seated Flexion Stretch with Swiss Ball  - 1 x daily - 7 x weekly - 3 sets - 10 reps - Half Kneeling Hip Flexor Stretch  - 1 x daily - 7 x weekly - 3 sets - 10 reps  ASSESSMENT:  CLINICAL IMPRESSION: Cueing required to maintain pelvic stability and abdominal bracing during deadbug progression. Support provided during seated forward flexion due to limited ROM with myofascial tightness. Patient had good response with lumbar self-decompression in kneeling; HEP updated with lumbar mobility and core strengthening exercises.   OBJECTIVE IMPAIRMENTS: decreased activity tolerance, decreased mobility, decreased ROM, decreased strength, increased muscle spasms, and pain.   ACTIVITY LIMITATIONS: carrying, lifting, bending, sitting, and standing  PARTICIPATION LIMITATIONS: occupation and yard work  PERSONAL FACTORS: 1-2 comorbidities: history of lumbar surgery and hip replacement  are also affecting patient's functional outcome.   REHAB POTENTIAL: Good  CLINICAL DECISION MAKING: Stable/uncomplicated  EVALUATION COMPLEXITY: Low   GOALS: Goals reviewed with patient? Yes  SHORT TERM GOALS: Target date: 10/10/2023  Pt will be independent with initial HEP Baseline: Goal status: INITIAL  2.  Pt will tolerate standing or sitting x 10 minutes with pain/symptoms <= 3/10 Baseline:  Goal status: INITIAL    LONG TERM GOALS: Target date: 11/07/2023  Pt will be independent with advanced HEP Baseline:  Goal status: INITIAL  2.  Pt will improve Modified Oswestry to <= 12% to demo improved functional mobility Baseline: 32% Goal status: INITIAL  3.  Pt will improve LE strength to 5/5 to improve ability to work without pain Baseline:  Goal status: INITIAL  4.  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  PLAN FOR NEXT SESSION: Continue lumbar mobility, core strength; review updated HEP as needed   Flint Hummer, PTA 09/15/2023, 11:52 AM

## 2023-09-17 ENCOUNTER — Telehealth: Payer: Self-pay | Admitting: Sports Medicine

## 2023-09-17 DIAGNOSIS — I10 Essential (primary) hypertension: Secondary | ICD-10-CM

## 2023-09-17 MED ORDER — AMLODIPINE BESYLATE 5 MG PO TABS: 5.0000 mg | ORAL_TABLET | Freq: Every day | ORAL | 3 refills | Status: DC

## 2023-09-17 NOTE — Assessment & Plan Note (Signed)
I spoke to Thomas Reilly's wife today in her appointment, she showed me his average blood pressures which were typically running in the 150s over 90s at home, we will add amlodipine 5 with another 2-week blood pressure check. Continue lisinopril 40.

## 2023-09-17 NOTE — Telephone Encounter (Signed)
I spoke to Thomas Reilly's wife today in her appointment, she showed me his average blood pressures which were typically running in the 150s over 90s at home, we will add amlodipine 5 with another 2-week blood pressure check. Continue lisinopril 40.

## 2023-09-19 ENCOUNTER — Ambulatory Visit: Payer: Medicare Other | Admitting: Rehabilitative and Restorative Service Providers"

## 2023-09-19 ENCOUNTER — Encounter: Payer: Self-pay | Admitting: Physical Therapy

## 2023-09-19 DIAGNOSIS — M48061 Spinal stenosis, lumbar region without neurogenic claudication: Secondary | ICD-10-CM | POA: Diagnosis not present

## 2023-09-19 DIAGNOSIS — M6281 Muscle weakness (generalized): Secondary | ICD-10-CM | POA: Diagnosis not present

## 2023-09-19 DIAGNOSIS — R29898 Other symptoms and signs involving the musculoskeletal system: Secondary | ICD-10-CM

## 2023-09-19 NOTE — Therapy (Signed)
OUTPATIENT PHYSICAL THERAPY THORACOLUMBAR TREATMENT   Patient Name: Thomas Reilly MRN: [id number] DOB:[birth year], 68 y.o., male Today's Date: 09/19/2023  END OF SESSION:  PT End of Session - 09/19/23 0803     Visit Number 3    Number of Visits 16    Date for PT Re-Evaluation 11/07/23    Authorization Type [employer] Medicare    Authorization - Number of Visits 16    Progress Note Due on Visit 10    PT Start Time 0805    PT Stop Time 0845    PT Time Calculation (min) 40 min    Activity Tolerance Patient tolerated treatment well    Behavior During Therapy [facility] for tasks assessed/performed             Past Medical History:  Diagnosis Date   Hypertension    Past Surgical History:  Procedure Laterality Date   BACK SURGERY     HERNIA REPAIR     TONSILLECTOMY     Patient Active Problem List   Diagnosis Date Noted   Lumbar spinal stenosis 09/05/2023   Bilateral carpal tunnel syndrome 09/05/2023   Cervical facet joint syndrome 09/05/2022   Obesity 01/25/2022   Allergic rhinitis 12/19/2021   External hemorrhoid 05/25/2021   Systolic murmur 01/24/2021   Erectile dysfunction 10/06/2018   Rotator cuff tendinitis, left 11/07/2017   History of total left hip arthroplasty 11/07/2017   Right proximal biceps tear 03/03/2017   Cubital tunnel syndrome of both upper extremities 12/11/2016   Diverticulitis of colon 10/30/2015   Mood disorder (HCC) 10/16/2015   Male hypogonadism 09/18/2015   Essential hypertension, benign 06/16/2015   Annual physical exam 06/16/2015   Seborrheic keratosis 06/16/2015   Hypertriglyceridemia 06/16/2015    PCP: [name]  REFERRING PROVIDER: [name]  REFERRING DIAG: spinal stenosis of lumbar region  Rationale for Evaluation and Treatment: Rehabilitation  THERAPY DIAG:  Other symptoms and signs involving the musculoskeletal system  Muscle weakness (generalized)  ONSET DATE: 06/2023  SUBJECTIVE:                                                                                                                                                                                            SUBJECTIVE STATEMENT: Patient reports continued achiness in L leg. He is doing HEP.  No change in pain at this time.  EVAL: Pt states that for the past 3-4 months he has had pain shooting down both legs. He has slight back pain but pain is mostly down the backs of his legs. 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 Pain is better with rest. Pt  did try prednisone for 5 days which helped while he was on it but pain returned now that he is off of meds  PERTINENT HISTORY:  Back surgery for ruptured disc approx. 45 years ago Lt hip replacement  PAIN:  Are you having pain? Yes: NPRS scale: 4-5/10 currently Pain location: back of bilat LEs Pain description: sharp, stabbing Aggravating factors: bend, lift, carry Relieving factors: rest  PRECAUTIONS: None  WEIGHT BEARING RESTRICTIONS: No  FALLS:  Has patient fallen in last 6 months? 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  DIAGNOSTIC FINDINGS:  X ray performed on eval but not read  PATIENT SURVEYS:  Modified Oswestry 32% 16/50   SENSATION: Sharp/stabbing Rt LE, dull ache Lt LE  MUSCLE LENGTH: Hamstrings: Right 70 deg; Left 70 deg  PALPATION: No TTP with CPAs or UPAs lumbar spine, SIJ Increased mm spasticity lumbar paraspinals bilat  LUMBAR ROM:  AROM eval  Flexion 50%  Extension 75%  Right lateral flexion 50%  Left lateral flexion 50%  Right rotation 50%  Left rotation 50%   (Blank rows = not tested)   LOWER EXTREMITY MMT:   MMT Right eval Left eval Right 09/19/23 Left 09/19/23  Hip flexion 4 pain 4 pain    Hip extension 4+ 4    Hip  abduction 4 4    Hip adduction      Hip internal rotation      Hip external rotation      Knee flexion      Knee extension      Ankle dorsiflexion   4+/5 4/5  Ankle plantarflexion      Ankle inversion      Ankle eversion   4+/5 4/5   (Blank rows = not tested)  LUMBAR SPECIAL TESTS:  Straight leg raise test: Negative  [facility] Adult PT Treatment:                                                DATE: 09/19/23 Therapeutic Exercise: Prone Mobilization with movement press ups Supine HS strap stretch Bridges x 10 reps Bridges x 12 reps with legs on physioball  Dead bug x 10 reps with cues on sequencing Piriformis stretch Hip flexion (thomas test stretch) Standing Heel cord stretch R and L x 2 reps x 20 seconds Ankle DF x 12 reps Ankle PF x 15 reps Squat progression starting with wall slides x 4 reps (easy), then progressed to squat with chair touch x 12 reps. Then 8# each hand squat lift reaching distal to knees x 12 reps Manual Therapy: Grade II-III CPA mobilizations  lumbar and low thoracic spine Gait: Observed gait noting less foot clearance on L LE -- and dec'd pronounced ankle DF at heel strike   [facility] Adult PT Treatment:                                                DATE: 09/15/2023 Therapeutic Exercise: SKTC --> DKTC LTR x10 --> wide leg LTR x10 Supine sciatic nerve glide (B) Prone  press up x10 Supine figure 4 stretch --> alt press down/pull up 5x10" Seated forward flexion stretch with orange PB S/L hip abd in IR x10 (B) S/L small leg circles abd x10 (B) 1/2 kneeling hip flexor stretch  1/2 kneeling lumbar decompression   TREATMENT DATE: 09/12/23 See HEP Lumbar traction 65# max, 45# min on 60 sec off 20 sec x 10 minutes                                                                                                                                 PATIENT EDUCATION:  Education details: Updated HEP Person educated: Patient Education method: Explanation, Demonstration,  and Handouts Education comprehension: verbalized understanding and returned demonstration  HOME EXERCISE PROGRAM: Access Code: [email or web address] URL: [email or web address] Date: 09/15/2023 Prepared by: Carlynn Herald  Exercises - Supine Lower Trunk Rotation  - 1 x daily - 7 x weekly - 3 sets - 10 reps - Seated Hamstring Stretch  - 1 x daily - 7 x weekly - 1 sets - 3 reps - 20-30 seconds hold - Barbell Jefferson Curl  - 1 x daily - 7 x weekly - 2 sets - 10 reps - Supine Sciatic Nerve Glide  - 2 x daily - 7 x weekly - 1 sets - 10 reps - Supine Figure 4 Piriformis Stretch  - 2 x daily - 7 x weekly - 1 sets - 10 reps - 10-15 sec hold - Supine Dead Bug with Leg Extension  - 1 x daily - 7 x weekly - 3 sets - 10 reps - Seated Flexion Stretch with Swiss Ball  - 1 x daily - 7 x weekly - 3 sets - 10 reps - Half Kneeling Hip Flexor Stretch  - 1 x daily - 7 x weekly - 3 sets - 10 reps  ASSESSMENT:  CLINICAL IMPRESSION: Patient tolerated exercise well in therapy, although overall reports no change in symptoms. PT to continue progressing. May consider continued decompression exercises and/or traction due to radiating symptoms. First visit-- he notes no change with traction, but may be work 1 more trial. Plan to continue to progress exercise and loading due to nature of job activities as he can tolerate.   GOALS: Goals reviewed with patient? Yes  SHORT TERM GOALS: Target date: 10/10/2023  Pt will be independent with initial HEP Baseline: Goal status: INITIAL  2.  Pt will tolerate standing or sitting x 10 minutes with pain/symptoms <= 3/10 Baseline:  Goal status: INITIAL  LONG TERM GOALS: Target date: 11/07/2023  Pt will be independent with advanced HEP Baseline:  Goal status: INITIAL  2.  Pt will improve Modified Oswestry to <= 12% to demo improved functional mobility Baseline: 32% Goal status: INITIAL  3.  Pt will improve LE strength to 5/5 to improve ability to work without  pain Baseline:  Goal status: INITIAL  4.  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  PLAN FOR NEXT SESSION: Continue lumbar mobility, core strength; review updated HEP as needed   Donelle Hise, PT 09/19/2023, 8:04 AM

## 2023-09-22 ENCOUNTER — Ambulatory Visit: Payer: Medicare Other

## 2023-09-22 DIAGNOSIS — R29898 Other symptoms and signs involving the musculoskeletal system: Secondary | ICD-10-CM

## 2023-09-22 DIAGNOSIS — M48061 Spinal stenosis, lumbar region without neurogenic claudication: Secondary | ICD-10-CM | POA: Diagnosis not present

## 2023-09-22 DIAGNOSIS — M6281 Muscle weakness (generalized): Secondary | ICD-10-CM | POA: Diagnosis not present

## 2023-09-22 NOTE — Therapy (Signed)
OUTPATIENT PHYSICAL THERAPY THORACOLUMBAR TREATMENT   Patient Name: Thomas Reilly MRN: [id number] DOB:02-14-1956, 68 y.o., male Today's Date: 09/22/2023  END OF SESSION:  PT End of Session - 09/22/23 0803     Visit Number 4    Number of Visits 16    Date for PT Re-Evaluation 11/07/23    Authorization Type [employer]    Authorization - Visit Number 3    Authorization - Number of Visits 16    Progress Note Due on Visit 10    PT Start Time 0803    PT Stop Time 0847    PT Time Calculation (min) 44 min    Activity Tolerance Patient tolerated treatment well    Behavior During Therapy [facility] for tasks assessed/performed             Past Medical History:  Diagnosis Date   Hypertension    Past Surgical History:  Procedure Laterality Date   BACK SURGERY     HERNIA REPAIR     TONSILLECTOMY     Patient Active Problem List   Diagnosis Date Noted   Lumbar spinal stenosis 09/05/2023   Bilateral carpal tunnel syndrome 09/05/2023   Cervical facet joint syndrome 09/05/2022   Obesity 01/25/2022   Allergic rhinitis 12/19/2021   External hemorrhoid 05/25/2021   Systolic murmur 01/24/2021   Erectile dysfunction 10/06/2018   Rotator cuff tendinitis, left 11/07/2017   History of total left hip arthroplasty 11/07/2017   Right proximal biceps tear 03/03/2017   Cubital tunnel syndrome of both upper extremities 12/11/2016   Diverticulitis of colon 10/30/2015   Mood disorder (HCC) 10/16/2015   Male hypogonadism 09/18/2015   Essential hypertension, benign 06/16/2015   Annual physical exam 06/16/2015   Seborrheic keratosis 06/16/2015   Hypertriglyceridemia 06/16/2015    PCP: [name]  REFERRING PROVIDER: [name]  REFERRING DIAG: spinal stenosis of lumbar region  Rationale for Evaluation and Treatment: Rehabilitation  THERAPY DIAG:  Other symptoms and signs involving the musculoskeletal system  Muscle weakness (generalized)  ONSET DATE: 06/2023  SUBJECTIVE:                                                                                                                                                                                            SUBJECTIVE STATEMENT: Patient reports no change in symptoms.  EVAL: Pt states that for the past 3-4 months he has had pain shooting down both legs. He has slight back pain but pain is mostly down the backs of his legs. 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 Pain is better with rest. Pt did try prednisone for 5  days which helped while he was on it but pain returned now that he is off of meds  PERTINENT HISTORY:  Back surgery for ruptured disc approx. 45 years ago Lt hip replacement  PAIN:  Are you having pain? Yes: NPRS scale: 4-5/10 currently Pain location: back of bilat LEs Pain description: sharp, stabbing Aggravating factors: bend, lift, carry Relieving factors: rest  PRECAUTIONS: None  WEIGHT BEARING RESTRICTIONS: No  FALLS:  Has patient fallen in last 6 months? 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  DIAGNOSTIC FINDINGS:  X ray performed on eval but not read  PATIENT SURVEYS:  Modified Oswestry 32% 16/50   SENSATION: Sharp/stabbing Rt LE, dull ache Lt LE  MUSCLE LENGTH: Hamstrings: Right 70 deg; Left 70 deg  PALPATION: No TTP with CPAs or UPAs lumbar spine, SIJ Increased mm spasticity lumbar paraspinals bilat  LUMBAR ROM:  AROM eval  Flexion 50%  Extension 75%  Right lateral flexion 50%  Left lateral flexion 50%  Right rotation 50%  Left rotation 50%   (Blank rows = not tested)   LOWER EXTREMITY MMT:   MMT Right eval Left eval Right 09/19/23 Left 09/19/23  Hip flexion 4 pain 4 pain    Hip extension 4+ 4    Hip abduction 4 4    Hip  adduction      Hip internal rotation      Hip external rotation      Knee flexion      Knee extension      Ankle dorsiflexion   4+/5 4/5  Ankle plantarflexion      Ankle inversion      Ankle eversion   4+/5 4/5   (Blank rows = not tested)  LUMBAR SPECIAL TESTS:  Straight leg raise test: Negative  [facility] Adult PT Treatment:                                                DATE: 09/22/2023 Therapeutic Exercise: Hooklying spinal progression: bent elbow rocks + head turn in opp direction --> wide leg wipers --> wiper legs + bent elbow rick & opp head S/L open books 10x3" Bridges + hip add iso (yoga block) --> hip abd iso (gait belt) Standing HS curls from toe tap position x10 (B) Prone:  Press up 10x3" Quad stretch w/strap 3x30" Isometric dead bug holds + breathing cues Modified singele leg deadlift with back foot against wall Quad/hip flexor stretch with back leg on chair  Neuromuscular re-ed: Hooklying unilateral resisted hip abd for pelvic stabilization Bridge + PPT & spinal articulation for lumbopelvic mobility   [facility] Adult PT Treatment:                                                DATE: 09/19/23 Therapeutic Exercise: Prone Mobilization with movement press ups Supine HS strap stretch Bridges x 10 reps Bridges x 12 reps with legs on physioball  Dead bug x 10 reps with cues on sequencing Piriformis stretch Hip flexion (thomas test stretch) Standing Heel cord  stretch R and L x 2 reps x 20 seconds Ankle DF x 12 reps Ankle PF x 15 reps Squat progression starting with wall slides x 4 reps (easy), then progressed to squat with chair touch x 12 reps. Then 8# each hand squat lift reaching distal to knees x 12 reps Manual Therapy: Grade II-III CPA mobilizations  lumbar and low thoracic spine Gait: Observed gait noting less foot clearance on L LE -- and dec'd pronounced ankle DF at heel strike   [facility] Adult PT Treatment:                                                DATE:  09/15/2023 Therapeutic Exercise: SKTC --> DKTC LTR x10 --> wide leg LTR x10 Supine sciatic nerve glide (B) Prone press up x10 Supine figure 4 stretch --> alt press down/pull up 5x10" Seated forward flexion stretch with orange PB S/L hip abd in IR x10 (B) S/L small leg circles abd x10 (B) 1/2 kneeling hip flexor stretch  1/2 kneeling lumbar decompression   TREATMENT DATE: 09/12/23 See HEP Lumbar traction 65# max, 45# min on 60 sec off 20 sec x 10 minutes                                                                                                                                 PATIENT EDUCATION:  Education details: Updated HEP Person educated: Patient Education method: Explanation, Demonstration, and Handouts Education comprehension: verbalized understanding and returned demonstration  HOME EXERCISE PROGRAM: Access Code: [email or web address] URL: [email or web address] Date: 09/22/2023 Prepared by: Carlynn Herald  Exercises - Supine Lower Trunk Rotation  - 1 x daily - 7 x weekly - 3 sets - 10 reps - Seated Hamstring Stretch  - 1 x daily - 7 x weekly - 1 sets - 3 reps - 20-30 seconds hold - Barbell Jefferson Curl  - 1 x daily - 7 x weekly - 2 sets - 10 reps - Supine Sciatic Nerve Glide  - 2 x daily - 7 x weekly - 1 sets - 10 reps - Supine Figure 4 Piriformis Stretch  - 2 x daily - 7 x weekly - 1 sets - 10 reps - 10-15 sec hold - Supine Dead Bug with Leg Extension  - 1 x daily - 7 x weekly - 3 sets - 10 reps - Seated Flexion Stretch with Swiss Ball  - 1 x daily - 7 x weekly - 3 sets - 10 reps - Isometric Dead Bug  - 1 x daily - 7 x weekly - 3 sets - 10 reps - Standing Hamstring Curl with Resistance  - 1 x daily - 7 x weekly - 3 sets - 10 reps - Sidelying Open Book Thoracic Lumbar Rotation and Extension  - 1 x daily - 7 x  weekly - 3 sets - 10 reps - Standing Hip Flexor Stretch with Foot Elevated  - 1 x daily - 7 x weekly - 3 sets - 10 reps  ASSESSMENT:  CLINICAL  IMPRESSION: Prone quad stretch added to address flexed hip posture noted in standing; tactile cues provided to improve pelvic alignment and awareness. Noted instability/weakness with lightly resisted prone hamstring curls. HEP updated with core and hamstring strengthening.    GOALS: Goals reviewed with patient? Yes  SHORT TERM GOALS: Target date: 10/10/2023  Pt will be independent with initial HEP Baseline: Goal status: INITIAL  2.  Pt will tolerate standing or sitting x 10 minutes with pain/symptoms <= 3/10 Baseline:  Goal status: INITIAL  LONG TERM GOALS: Target date: 11/07/2023  Pt will be independent with advanced HEP Baseline:  Goal status: INITIAL  2.  Pt will improve Modified Oswestry to <= 12% to demo improved functional mobility Baseline: 32% Goal status: INITIAL  3.  Pt will improve LE strength to 5/5 to improve ability to work without pain Baseline:  Goal status: INITIAL  4.  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  PLAN FOR NEXT SESSION: Lumbar traction next visit. Continue lumbar mobility, core strength; review updated HEP as needed   Sanjuana Mae, PTA 09/22/2023, 8:48 AM

## 2023-09-24 ENCOUNTER — Ambulatory Visit: Payer: Medicare Other

## 2023-09-24 DIAGNOSIS — M48061 Spinal stenosis, lumbar region without neurogenic claudication: Secondary | ICD-10-CM | POA: Diagnosis not present

## 2023-09-24 DIAGNOSIS — M6281 Muscle weakness (generalized): Secondary | ICD-10-CM

## 2023-09-24 DIAGNOSIS — R29898 Other symptoms and signs involving the musculoskeletal system: Secondary | ICD-10-CM | POA: Diagnosis not present

## 2023-09-24 NOTE — Therapy (Signed)
OUTPATIENT PHYSICAL THERAPY THORACOLUMBAR TREATMENT   Patient Name: Thomas Reilly MRN: [id number] DOB:1956/06/03, 68 y.o., male Today's Date: 09/24/2023  END OF SESSION:  PT End of Session - 09/24/23 0804     Visit Number 5    Number of Visits 16    Date for PT Re-Evaluation 11/07/23    Authorization Type [employer] Medicare    Authorization - Visit Number 4    Authorization - Number of Visits 16    Progress Note Due on Visit 10    PT Start Time 0804    PT Stop Time 0857    PT Time Calculation (min) 53 min    Activity Tolerance Patient tolerated treatment well    Behavior During Therapy [facility] for tasks assessed/performed             Past Medical History:  Diagnosis Date   Hypertension    Past Surgical History:  Procedure Laterality Date   BACK SURGERY     HERNIA REPAIR     TONSILLECTOMY     Patient Active Problem List   Diagnosis Date Noted   Lumbar spinal stenosis 09/05/2023   Bilateral carpal tunnel syndrome 09/05/2023   Cervical facet joint syndrome 09/05/2022   Obesity 01/25/2022   Allergic rhinitis 12/19/2021   External hemorrhoid 05/25/2021   Systolic murmur 01/24/2021   Erectile dysfunction 10/06/2018   Rotator cuff tendinitis, left 11/07/2017   History of total left hip arthroplasty 11/07/2017   Right proximal biceps tear 03/03/2017   Cubital tunnel syndrome of both upper extremities 12/11/2016   Diverticulitis of colon 10/30/2015   Mood disorder (HCC) 10/16/2015   Male hypogonadism 09/18/2015   Essential hypertension, benign 06/16/2015   Annual physical exam 06/16/2015   Seborrheic keratosis 06/16/2015   Hypertriglyceridemia 06/16/2015    PCP: [name]  REFERRING PROVIDER: [name]  REFERRING DIAG: spinal stenosis of lumbar region  Rationale for Evaluation and Treatment: Rehabilitation  THERAPY DIAG:  Other symptoms and signs involving the musculoskeletal system  Muscle weakness (generalized)  ONSET DATE: 06/2023  SUBJECTIVE:                                                                                                                                                                                            SUBJECTIVE STATEMENT: Patient reports he continues to have "shooting pain" down   EVAL: Pt states that for the past 3-4 months he has had pain shooting down both legs. He has slight back pain but pain is mostly down the backs of his legs. 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 Pain is better with rest. Pt did  try prednisone for 5 days which helped while he was on it but pain returned now that he is off of meds  PERTINENT HISTORY:  Back surgery for ruptured disc approx. 45 years ago Lt hip replacement  PAIN:  Are you having pain? Yes: NPRS scale: 4-5/10 currently Pain location: back of bilat LEs Pain description: sharp, stabbing Aggravating factors: bend, lift, carry Relieving factors: rest  PRECAUTIONS: None  WEIGHT BEARING RESTRICTIONS: No  FALLS:  Has patient fallen in last 6 months? 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  DIAGNOSTIC FINDINGS:  X ray performed on eval but not read  PATIENT SURVEYS:  Modified Oswestry 32% 16/50   SENSATION: Sharp/stabbing Rt LE, dull ache Lt LE  MUSCLE LENGTH: Hamstrings: Right 70 deg; Left 70 deg  PALPATION: No TTP with CPAs or UPAs lumbar spine, SIJ Increased mm spasticity lumbar paraspinals bilat  LUMBAR ROM:  AROM eval  Flexion 50%  Extension 75%  Right lateral flexion 50%  Left lateral flexion 50%  Right rotation 50%  Left rotation 50%   (Blank rows = not tested)   LOWER EXTREMITY MMT:   MMT Right eval Left eval Right 09/19/23 Left 09/19/23  Hip flexion 4 pain 4 pain    Hip extension 4+ 4    Hip  abduction 4 4    Hip adduction      Hip internal rotation      Hip external rotation      Knee flexion      Knee extension      Ankle dorsiflexion   4+/5 4/5  Ankle plantarflexion      Ankle inversion      Ankle eversion   4+/5 4/5   (Blank rows = not tested)  LUMBAR SPECIAL TESTS:  Straight leg raise test: Negative   [facility] Adult PT Treatment:                                                DATE: 09/24/2023 Therapeutic Exercise: Seated green PB roll out  Self-traction with black tTB & purple superband (did not feel much) Figure 4 LTR Side Lying:  Hip abd in IR: 20*, 45* 90* Hip ext in abd Neuromuscular re-ed: 1/2 kneeling for lumbar decompression Seated sciatic nerve glide (slump) Modalities: Lumbar traction (BW 200#) 80# max (40%), 45# min, intermittent x 15 min                                                                                             [facility] Adult PT Treatment:  DATE: 09/22/2023 Therapeutic Exercise: Hooklying spinal progression: bent elbow rocks + head turn in opp direction --> wide leg wipers --> wiper legs + bent elbow rick & opp head S/L open books 10x3" Bridges + hip add iso (yoga block) --> hip abd iso (gait belt) Standing HS curls from toe tap position x10 (B) Prone:  Press up 10x3" Quad stretch w/strap 3x30" Isometric dead bug holds + breathing cues Modified singele leg deadlift with back foot against wall Quad/hip flexor stretch with back leg on chair  Neuromuscular re-ed: Hooklying unilateral resisted hip abd for pelvic stabilization Bridge + PPT & spinal articulation for lumbopelvic mobility   [facility] Adult PT Treatment:                                                DATE: 09/19/23 Therapeutic Exercise: Prone Mobilization with movement press ups Supine HS strap stretch Bridges x 10 reps Bridges x 12 reps with legs on physioball  Dead bug x 10 reps with cues on sequencing Piriformis stretch Hip  flexion (thomas test stretch) Standing Heel cord stretch R and L x 2 reps x 20 seconds Ankle DF x 12 reps Ankle PF x 15 reps Squat progression starting with wall slides x 4 reps (easy), then progressed to squat with chair touch x 12 reps. Then 8# each hand squat lift reaching distal to knees x 12 reps Manual Therapy: Grade II-III CPA mobilizations  lumbar and low thoracic spine Gait: Observed gait noting less foot clearance on L LE -- and dec'd pronounced ankle DF at heel strike   [facility] Adult PT Treatment:                                                DATE: 09/15/2023 Therapeutic Exercise: SKTC --> DKTC LTR x10 --> wide leg LTR x10 Supine sciatic nerve glide (B) Prone press up x10 Supine figure 4 stretch --> alt press down/pull up 5x10" Seated forward flexion stretch with orange PB S/L hip abd in IR x10 (B) S/L small leg circles abd x10 (B) 1/2 kneeling hip flexor stretch  1/2 kneeling lumbar decompression                                     PATIENT EDUCATION:  Education details: Updated HEP Person educated: Patient Education method: Explanation, Demonstration, and Handouts Education comprehension: verbalized understanding and returned demonstration  HOME EXERCISE PROGRAM: Access Code: [email or web address] URL: [email or web address] Date: 09/22/2023 Prepared by: Carlynn Herald  Exercises - Supine Lower Trunk Rotation  - 1 x daily - 7 x weekly - 3 sets - 10 reps - Seated Hamstring Stretch  - 1 x daily - 7 x weekly - 1 sets - 3 reps - 20-30 seconds hold - Barbell Jefferson Curl  - 1 x daily - 7 x weekly - 2 sets - 10 reps - Supine Sciatic Nerve Glide  - 2 x daily - 7 x weekly - 1 sets - 10 reps - Supine Figure 4 Piriformis Stretch  - 2 x daily - 7 x weekly - 1 sets - 10 reps - 10-15 sec hold - Supine Dead Bug with Leg Extension  -  1 x daily - 7 x weekly - 3 sets - 10 reps - Seated Flexion Stretch with Swiss Ball  - 1 x daily - 7 x weekly - 3 sets - 10 reps - Isometric  Dead Bug  - 1 x daily - 7 x weekly - 3 sets - 10 reps - Standing Hamstring Curl with Resistance  - 1 x daily - 7 x weekly - 3 sets - 10 reps - Sidelying Open Book Thoracic Lumbar Rotation and Extension  - 1 x daily - 7 x weekly - 3 sets - 10 reps - Standing Hip Flexor Stretch with Foot Elevated  - 1 x daily - 7 x weekly - 3 sets - 10 reps  ASSESSMENT:  CLINICAL IMPRESSION: Trial of self-traction with theraband attempted, however patient reported no significant traction response. Mechanical lumbar traction performed again; will assess next visit. Glute strengthening continued with side lying exercises. Patient reports decreased pain in low back with self-traction variations, however no change in radiating pain down bilateral legs (L>R).  GOALS: Goals reviewed with patient? Yes  SHORT TERM GOALS: Target date: 10/10/2023  Pt will be independent with initial HEP Baseline: Goal status: INITIAL  2.  Pt will tolerate standing or sitting x 10 minutes with pain/symptoms <= 3/10 Baseline:  Goal status: INITIAL  LONG TERM GOALS: Target date: 11/07/2023  Pt will be independent with advanced HEP Baseline:  Goal status: INITIAL  2.  Pt will improve Modified Oswestry to <= 12% to demo improved functional mobility Baseline: 32% Goal status: INITIAL  3.  Pt will improve LE strength to 5/5 to improve ability to work without pain Baseline:  Goal status: INITIAL  4.  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  PLAN FOR  NEXT SESSION: Assess response to lumbar traction. Hamstring strengthening. Continue lumbar mobility, core strength; review updated HEP as needed   Sanjuana Mae, PTA 09/24/2023, 8:47 AM

## 2023-09-25 ENCOUNTER — Other Ambulatory Visit: Payer: Self-pay | Admitting: Sports Medicine

## 2023-09-25 DIAGNOSIS — I10 Essential (primary) hypertension: Secondary | ICD-10-CM

## 2023-09-25 DIAGNOSIS — E781 Pure hyperglyceridemia: Secondary | ICD-10-CM

## 2023-09-30 ENCOUNTER — Ambulatory Visit: Payer: Medicare Other

## 2023-10-03 ENCOUNTER — Ambulatory Visit: Payer: Medicare Other | Admitting: Physical Therapy

## 2023-10-07 ENCOUNTER — Ambulatory Visit: Payer: Medicare Other | Attending: Sports Medicine

## 2023-10-07 DIAGNOSIS — R29898 Other symptoms and signs involving the musculoskeletal system: Secondary | ICD-10-CM | POA: Insufficient documentation

## 2023-10-07 DIAGNOSIS — M6281 Muscle weakness (generalized): Secondary | ICD-10-CM | POA: Diagnosis present

## 2023-10-07 NOTE — Therapy (Signed)
 OUTPATIENT PHYSICAL THERAPY THORACOLUMBAR TREATMENT   Patient Name: Thomas Reilly MRN: [id number] DOB:04-Jun-1956, 68 y.o., male Today's Date: 10/07/2023  END OF SESSION:  PT End of Session - 10/07/23 0841     Visit Number 6    Number of Visits 16    Date for PT Re-Evaluation 11/07/23    Authorization Type [employer] Medicare    Progress Note Due on Visit 10    PT Start Time 0804    PT Stop Time 0842    PT Time Calculation (min) 38 min    Activity Tolerance Patient tolerated treatment well    Behavior During Therapy [facility] for tasks assessed/performed            Past Medical History:  Diagnosis Date   Hypertension    Past Surgical History:  Procedure Laterality Date   BACK SURGERY     HERNIA REPAIR     TONSILLECTOMY     Patient Active Problem List   Diagnosis Date Noted   Lumbar spinal stenosis 09/05/2023   Bilateral carpal tunnel syndrome 09/05/2023   Cervical facet joint syndrome 09/05/2022   Obesity 01/25/2022   Allergic rhinitis 12/19/2021   External hemorrhoid 05/25/2021   Systolic murmur 01/24/2021   Erectile dysfunction 10/06/2018   Rotator cuff tendinitis, left 11/07/2017   History of total left hip arthroplasty 11/07/2017   Right proximal biceps tear 03/03/2017   Cubital tunnel syndrome of both upper extremities 12/11/2016   Diverticulitis of colon 10/30/2015   Mood disorder (HCC) 10/16/2015   Male hypogonadism 09/18/2015   Essential hypertension, benign 06/16/2015   Annual physical exam 06/16/2015   Seborrheic keratosis 06/16/2015   Hypertriglyceridemia 06/16/2015    PCP: [name]  REFERRING PROVIDER: [name]  REFERRING DIAG: spinal stenosis of lumbar region  Rationale for Evaluation and Treatment: Rehabilitation  THERAPY DIAG:  Other symptoms and signs involving the musculoskeletal system  Muscle weakness (generalized)  ONSET DATE: 06/2023  SUBJECTIVE:                                                                                                                                                                                            SUBJECTIVE STATEMENT: Patient reports no significant change in overall symptoms; states when he sits down at the end of the day he has slightly less pain in R leg. 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  EVAL: Pt states that for the past 3-4 months he has had pain shooting down both legs. He has slight back pain but pain is mostly down the  backs of his legs. 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 Pain is better with rest. Pt did try prednisone for 5 days which helped while he was on it but pain returned now that he is off of meds  PERTINENT HISTORY:  Back surgery for ruptured disc approx. 45 years ago Lt hip replacement  PAIN:  Are you having pain? Yes: NPRS scale: 4-5/10 currently Pain location: back of bilat LEs Pain description: sharp, stabbing Aggravating factors: bend, lift, carry Relieving factors: rest  PRECAUTIONS: None  WEIGHT BEARING RESTRICTIONS: No  FALLS:  Has patient fallen in last 6 months? 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  DIAGNOSTIC FINDINGS:  X ray performed on eval but not read  PATIENT SURVEYS:  Modified Oswestry 32% 16/50   SENSATION: Sharp/stabbing Rt LE, dull ache Lt LE  MUSCLE LENGTH: Hamstrings: Right 70 deg; Left 70 deg  PALPATION: No TTP with CPAs or UPAs lumbar spine, SIJ Increased mm spasticity lumbar paraspinals bilat  LUMBAR ROM:  AROM eval  Flexion 50%  Extension 75%  Right lateral flexion 50%  Left lateral flexion 50%  Right rotation 50%  Left rotation 50%   (Blank rows = not tested)   LOWER EXTREMITY  MMT:   MMT Right eval Left eval Right 09/19/23 Left 09/19/23  Hip flexion 4 pain 4 pain    Hip extension 4+ 4    Hip abduction 4 4    Hip adduction      Hip internal rotation      Hip external rotation      Knee flexion      Knee extension      Ankle dorsiflexion   4+/5 4/5  Ankle plantarflexion      Ankle inversion      Ankle eversion   4+/5 4/5   (Blank rows = not tested)  LUMBAR SPECIAL TESTS:  Straight leg raise test: Negative   [facility] Adult PT Treatment:                                                DATE: 10/07/2023 Therapeutic Exercise: Figure 4 stretch  Figure 4 LTR Prone HS curls + RTB --> GTB immediately cramped R HS HS stretch (R) Self-massage/TPR release with 4" ball --> tennis ball Review of HEP Manual Therapy: Gentle IASTM/STM (R) HS Trigger point release (R) HS   [facility] Adult PT Treatment:                                                DATE: 09/24/2023 Therapeutic Exercise: Seated green PB roll out  Self-traction with black tTB & purple superband (did not feel much) Figure 4 LTR Side Lying:  Hip abd in IR: 20*, 45* 90* Hip ext in abd Neuromuscular re-ed: 1/2 kneeling for lumbar decompression Seated sciatic nerve glide (slump) Modalities: Lumbar traction (BW 200#) 80# max (40%), 45# min, intermittent x 15 min  [facility] Adult PT Treatment:                                                DATE: 09/22/2023 Therapeutic Exercise: Hooklying spinal progression: bent elbow rocks + head turn in opp direction --> wide leg wipers --> wiper legs + bent elbow rick & opp head S/L open books 10x3" Bridges + hip add iso (yoga block) --> hip abd iso (gait belt) Standing HS curls from toe tap position x10 (B) Prone:  Press up 10x3" Quad stretch w/strap 3x30" Isometric dead bug holds + breathing cues Modified singele leg deadlift with back foot against wall Quad/hip flexor stretch  with back leg on chair  Neuromuscular re-ed: Hooklying unilateral resisted hip abd for pelvic stabilization Bridge + PPT & spinal articulation for lumbopelvic mobility   [facility] Adult PT Treatment:                                                DATE: 09/19/23 Therapeutic Exercise: Prone Mobilization with movement press ups Supine HS strap stretch Bridges x 10 reps Bridges x 12 reps with legs on physioball  Dead bug x 10 reps with cues on sequencing Piriformis stretch Hip flexion (thomas test stretch) Standing Heel cord stretch R and L x 2 reps x 20 seconds Ankle DF x 12 reps Ankle PF x 15 reps Squat progression starting with wall slides x 4 reps (easy), then progressed to squat with chair touch x 12 reps. Then 8# each hand squat lift reaching distal to knees x 12 reps Manual Therapy: Grade II-III CPA mobilizations  lumbar and low thoracic spine Gait: Observed gait noting less foot clearance on L LE -- and dec'd pronounced ankle DF at heel strike   [facility] Adult PT Treatment:                                                DATE: 09/15/2023 Therapeutic Exercise: SKTC --> DKTC LTR x10 --> wide leg LTR x10 Supine sciatic nerve glide (B) Prone press up x10 Supine figure 4 stretch --> alt press down/pull up 5x10" Seated forward flexion stretch with orange PB S/L hip abd in IR x10 (B) S/L small leg circles abd x10 (B) 1/2 kneeling hip flexor stretch  1/2 kneeling lumbar decompression                                     PATIENT EDUCATION:  Education details: Updated HEP Person educated: Patient Education method: Explanation, Demonstration, and Handouts Education comprehension: verbalized understanding and returned demonstration  HOME EXERCISE PROGRAM: Access Code: [email or web address] URL: [email or web address] Date: 10/07/2023 Prepared by: Carlynn Herald  Exercises - Seated Hamstring Stretch  - 1 x daily - 7 x weekly - 1 sets - 3 reps - 20-30 seconds hold - Seated Flexion  Stretch with Swiss Ball  - 1 x daily - 7 x weekly - 3 sets - 10 reps - Supine Lower Trunk Rotation  - 1 x daily - 7 x weekly - 3 sets -  10 reps - Supine Dead Bug with Leg Extension  - 1 x daily - 7 x weekly - 3 sets - 10 reps - Standing Hamstring Curl with Resistance  - 1 x daily - 7 x weekly - 3 sets - 10 reps - Barbell Jefferson Curl  - 1 x daily - 7 x weekly - 2 sets - 10 reps  ASSESSMENT:  CLINICAL IMPRESSION: Attempted progressing resistance with prone HS curls, however R hamstring immediately cramped and took a while to relax; trigger point release provided mild relief. Discussion with patient in monitoring hydration levels and continues hamstring strengthening/stretching as tolerated. HEP edited and reviewed with patient to improve compliance.   GOALS: Goals reviewed with patient? Yes  SHORT TERM GOALS: Target date: 10/10/2023  Pt will be independent with initial HEP Baseline: Goal status: MET  2.  Pt will tolerate standing or sitting x 10 minutes with pain/symptoms <= 3/10 Baseline: 6-7/10  Goal status: NOT MET   LONG TERM GOALS: Target date: 11/07/2023  Pt will be independent with advanced HEP Baseline:  Goal status: INITIAL  2.  Pt will improve Modified Oswestry to <= 12% to demo improved functional mobility Baseline: 32% Goal status: INITIAL  3.  Pt will improve LE strength to 5/5 to improve ability to work without pain Baseline:  Goal status: INITIAL  4.  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  PLAN FOR NEXT SESSION: Update POC/discussion on whether or not continuing PT is appropriate. Continue lumbar mobility, core strength.   Sanjuana Mae, PTA 10/07/2023, 8:43 AM

## 2023-10-10 ENCOUNTER — Encounter: Payer: Self-pay | Admitting: Physical Therapy

## 2023-10-10 ENCOUNTER — Ambulatory Visit: Payer: Medicare Other | Admitting: Physical Therapy

## 2023-10-10 DIAGNOSIS — R29898 Other symptoms and signs involving the musculoskeletal system: Secondary | ICD-10-CM | POA: Diagnosis not present

## 2023-10-10 DIAGNOSIS — M6281 Muscle weakness (generalized): Secondary | ICD-10-CM | POA: Diagnosis not present

## 2023-10-10 NOTE — Therapy (Addendum)
 OUTPATIENT PHYSICAL THERAPY THORACOLUMBAR TREATMENT AND DISCHARGE   Patient Name: Thomas Reilly MRN: [id number] DOB:1955/08/15, 68 y.o., male Today's Date: 10/10/2023  END OF SESSION:  PT End of Session - 10/10/23 0834     Visit Number 7    Number of Visits 16    Date for PT Re-Evaluation 11/07/23    Authorization Type [employer] Medicare    Authorization - Visit Number 7    Authorization - Number of Visits 16    Progress Note Due on Visit 10    PT Start Time 0800    PT Stop Time 0829    PT Time Calculation (min) 29 min    Activity Tolerance Patient tolerated treatment well    Behavior During Therapy [facility] for tasks assessed/performed             Past Medical History:  Diagnosis Date   Hypertension    Past Surgical History:  Procedure Laterality Date   BACK SURGERY     HERNIA REPAIR     TONSILLECTOMY     Patient Active Problem List   Diagnosis Date Noted   Lumbar spinal stenosis 09/05/2023   Bilateral carpal tunnel syndrome 09/05/2023   Cervical facet joint syndrome 09/05/2022   Obesity 01/25/2022   Allergic rhinitis 12/19/2021   External hemorrhoid 05/25/2021   Systolic murmur 01/24/2021   Erectile dysfunction 10/06/2018   Rotator cuff tendinitis, left 11/07/2017   History of total left hip arthroplasty 11/07/2017   Right proximal biceps tear 03/03/2017   Cubital tunnel syndrome of both upper extremities 12/11/2016   Diverticulitis of colon 10/30/2015   Mood disorder (HCC) 10/16/2015   Male hypogonadism 09/18/2015   Essential hypertension, benign 06/16/2015   Annual physical exam 06/16/2015   Seborrheic keratosis 06/16/2015   Hypertriglyceridemia 06/16/2015    PCP: Sandy Crumb  REFERRING PROVIDER: Sandy Crumb  REFERRING DIAG: spinal stenosis of lumbar region  Rationale for Evaluation and Treatment: Rehabilitation  THERAPY DIAG:  Other symptoms and signs involving the musculoskeletal system  Muscle weakness (generalized)  ONSET DATE:  06/2023  SUBJECTIVE:                                                                                                                                                                                           SUBJECTIVE STATEMENT: Patient reports he has pain in Rt LE after working but continues to have pain in Lt LE "all the time". Pain in Lt LE is 4/10 but pain in Rt LE can be up to 7/10  EVAL: Pt states that for the past 3-4 months he has had pain shooting down both legs. He has slight back pain  but pain is mostly down the backs of his legs. 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 Pain is better with rest. Pt did try prednisone  for 5 days which helped while he was on it but pain returned now that he is off of meds  PERTINENT HISTORY:  Back surgery for ruptured disc approx. 45 years ago Lt hip replacement  PAIN:  Are you having pain? Yes: NPRS scale: 4-5/10 currently Pain location: back of bilat LEs Pain description: sharp, stabbing Aggravating factors: bend, lift, carry Relieving factors: rest  PRECAUTIONS: None  WEIGHT BEARING RESTRICTIONS: No  FALLS:  Has patient fallen in last 6 months? 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  DIAGNOSTIC FINDINGS:  X ray performed on eval but not read  PATIENT SURVEYS:  Modified Oswestry 32% 16/50   SENSATION: Sharp/stabbing Rt LE, dull ache Lt LE  MUSCLE LENGTH: Hamstrings: Right 70 deg; Left 70 deg  PALPATION: No TTP with CPAs or UPAs lumbar spine, SIJ Increased mm spasticity lumbar paraspinals bilat  LUMBAR ROM:  AROM eval 10/10/23  Flexion 50% 60%  Extension 75% 75%  Right lateral flexion 50% 75%  Left lateral flexion 50% 50%  Right rotation 50% 75%  Left rotation 50% 75%   (Blank rows =  not tested)   LOWER EXTREMITY MMT:   MMT Right eval Left eval Right 09/19/23 Left 09/19/23  Hip flexion 4 pain 4 pain    Hip extension 4+ 4    Hip abduction 4 4    Hip adduction      Hip internal rotation      Hip external rotation      Knee flexion      Knee extension      Ankle dorsiflexion   4+/5 4/5  Ankle plantarflexion      Ankle inversion      Ankle eversion   4+/5 4/5   (Blank rows = not tested)  LUMBAR SPECIAL TESTS:  Straight leg raise test: Negative   [facility] Adult PT Treatment:                                                DATE: 10/10/23 Therapeutic Exercise/Activity: Forward flexion stretch at counter - felt only hamstring stretch Lumbar self traction supine with feet on chair - some relief Seated pelvic tilt 2 x 10 Standing lumbar ext - no change in symptoms Sciatic nerve glide seated Squat tap 2 x 10 Seated HS stretch x 30 sec bilat Education on d/c recommendations and activity modifications Oswestry 22%   [facility] Adult PT Treatment:                                                DATE: 10/07/2023 Therapeutic Exercise: Figure 4 stretch  Figure 4 LTR Prone HS curls + RTB --> GTB immediately cramped R HS HS stretch (R) Self-massage/TPR release with 4" ball --> tennis ball Review of HEP Manual Therapy: Gentle IASTM/STM (R) HS Trigger point release (R) HS   [facility]  Adult PT Treatment:                                                DATE: 09/24/2023 Therapeutic Exercise: Seated green PB roll out  Self-traction with black tTB & purple superband (did not feel much) Figure 4 LTR Side Lying:  Hip abd in IR: 20*, 45* 90* Hip ext in abd Neuromuscular re-ed: 1/2 kneeling for lumbar decompression Seated sciatic nerve glide (slump) Modalities: Lumbar traction (BW 200#) 80# max (40%), 45# min, intermittent x 15 min                                                                                             [facility] Adult PT Treatment:                                                 DATE: 09/22/2023 Therapeutic Exercise: Hooklying spinal progression: bent elbow rocks + head turn in opp direction --> wide leg wipers --> wiper legs + bent elbow rick & opp head S/L open books 10x3" Bridges + hip add iso (yoga block) --> hip abd iso (gait belt) Standing HS curls from toe tap position x10 (B) Prone:  Press up 10x3" Quad stretch w/strap 3x30" Isometric dead bug holds + breathing cues Modified singele leg deadlift with back foot against wall Quad/hip flexor stretch with back leg on chair  Neuromuscular re-ed: Hooklying unilateral resisted hip abd for pelvic stabilization Bridge + PPT & spinal articulation for lumbopelvic mobility                            PATIENT EDUCATION:  Education details: Updated HEP Person educated: Patient Education method: Explanation, Demonstration, and Handouts Education comprehension: verbalized understanding and returned demonstration  HOME EXERCISE PROGRAM: Access Code: [email or web address] URL: [email or web address] Date: 10/10/2023 Prepared by: Lowery Rue  Exercises - Seated Hamstring Stretch  - 1 x daily - 7 x weekly - 1 sets - 3 reps - 20-30 seconds hold - Seated Flexion Stretch with Swiss Ball  - 1 x daily - 7 x weekly - 3 sets - 10 reps - Supine Lower Trunk Rotation  - 1 x daily - 7 x weekly - 3 sets - 10 reps - Barbell Jefferson Curl  - 1 x daily - 7 x weekly - 2 sets - 10 reps - Seated Sciatic Tensioner  - 1 x daily - 7 x weekly - 2 sets - 10 reps - Seated Pelvic Tilt  - 1 x daily - 7 x weekly - 3 sets - 10 reps - Ankle Pumps in Elevation  - 1 x daily - 7 x weekly - 1 sets - 1 reps - 3-5 minutes hold  ASSESSMENT:  CLINICAL IMPRESSION: Pt has made some progress towards goals. He reports less LE pain than initially and  has improved functional activity tolerance. He continues to have increased pain and wishes to hold PT at this time and return to MD. Pt will continue to benefit from skilled PT services to  continue to progress core strength and endurance and to reduce pain and improve activity tolerance  GOALS: Goals reviewed with patient? Yes  SHORT TERM GOALS: Target date: 10/10/2023  Pt will be independent with initial HEP Baseline: Goal status: MET  2.  Pt will tolerate standing or sitting x 10 minutes with pain/symptoms <= 3/10 Baseline: 6-7/10  Goal status: NOT MET - 4/10 on 10/10/23   LONG TERM GOALS: Target date: 11/07/2023  Pt will be independent with advanced HEP Baseline:  Goal status: MET  2.  Pt will improve Modified Oswestry to <= 12% to demo improved functional mobility Baseline: 32% Goal status: NOT MET - 22% on 3/7  3.  Pt will improve LE strength to 5/5 to improve ability to work without pain Baseline:  Goal status: IN PROGRESS  4.  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  PLAN FOR NEXT SESSION: HOLD PT Continue lumbar mobility, core strength.   Dymond Spreen, PT 10/10/2023, 8:35 AM   PHYSICAL THERAPY DISCHARGE SUMMARY  Visits from Start of Care: 7  Current functional level related to goals / functional outcomes: Improved activity tolerance   Remaining deficits: See above   Education / Equipment: HEP   Patient agrees to discharge. Patient goals were partially met. Patient is being discharged due to the patient's request.  Lowery Rue, PT,DPT04/25/259:12 AM

## 2023-10-14 ENCOUNTER — Ambulatory Visit: Payer: Medicare Other

## 2023-10-17 ENCOUNTER — Ambulatory Visit: Payer: Medicare Other | Admitting: Physical Therapy

## 2023-10-17 ENCOUNTER — Encounter: Payer: Self-pay | Admitting: Sports Medicine

## 2023-10-17 ENCOUNTER — Ambulatory Visit (INDEPENDENT_AMBULATORY_CARE_PROVIDER_SITE_OTHER): Payer: Medicare Other | Admitting: Sports Medicine

## 2023-10-17 VITALS — BP 136/70 | HR 61 | Resp 20

## 2023-10-17 DIAGNOSIS — M48061 Spinal stenosis, lumbar region without neurogenic claudication: Secondary | ICD-10-CM | POA: Diagnosis not present

## 2023-10-17 DIAGNOSIS — I10 Essential (primary) hypertension: Secondary | ICD-10-CM | POA: Diagnosis not present

## 2023-10-17 MED ORDER — GABAPENTIN 300 MG PO CAPS: ORAL_CAPSULE | ORAL | 3 refills | Status: DC

## 2023-10-17 NOTE — Progress Notes (Addendum)
    Procedures performed today:    None.  Independent interpretation of notes and tests performed by another provider:   None.  Brief History, Exam, Impression, and Recommendations:    Essential hypertension, benign Well-controlled, no changes for now.  Lumbar spinal stenosis Nabor continues to have axial low back pain worse with standing, radiation down both legs to the back of both calves He had a slight improvement with prednisone but unfortunately continues to have worsening of pain Worse when driving this time. Due to failure of conservative treatment including PT over greater than 6 weeks we will proceed with an MRI for interventional planning, adding gabapentin as well, pain is predominantly left L5 versus S1.  He would like me to go and order the epidural once we see the MRI results.  Update: MRI does show severe spinal stenosis, proceeding with epidural, left L5-S1 transforaminal  Chronic process with exacerbation and pharmacologic intervention  ____________________________________________ [name]. [name], M.D., ABFM., CAQSM., AME. Primary Care and Sports Medicine [facility]  Adjunct Professor of Family Medicine  [employer] [facility] [employer]  Restaurant manager, fast food

## 2023-10-17 NOTE — Assessment & Plan Note (Signed)
 Well-controlled, no changes for now.

## 2023-10-17 NOTE — Assessment & Plan Note (Addendum)
 Shion continues to have axial low back pain worse with standing, radiation down both legs to the back of both calves He had a slight improvement with prednisone but unfortunately continues to have worsening of pain Worse when driving this time. Due to failure of conservative treatment including PT over greater than 6 weeks we will proceed with an MRI for interventional planning, adding gabapentin as well, pain is predominantly left L5 versus S1.  He would like me to go and order the epidural once we see the MRI results.  Update: MRI does show severe spinal stenosis, proceeding with epidural, left L5-S1 transforaminal

## 2023-10-26 ENCOUNTER — Ambulatory Visit

## 2023-10-26 DIAGNOSIS — M47815 Spondylosis without myelopathy or radiculopathy, thoracolumbar region: Secondary | ICD-10-CM | POA: Diagnosis not present

## 2023-10-26 DIAGNOSIS — M48061 Spinal stenosis, lumbar region without neurogenic claudication: Secondary | ICD-10-CM | POA: Diagnosis not present

## 2023-10-26 DIAGNOSIS — K5730 Diverticulosis of large intestine without perforation or abscess without bleeding: Secondary | ICD-10-CM | POA: Diagnosis not present

## 2023-10-26 DIAGNOSIS — M4186 Other forms of scoliosis, lumbar region: Secondary | ICD-10-CM | POA: Diagnosis not present

## 2023-11-04 ENCOUNTER — Telehealth: Payer: Self-pay | Admitting: Sports Medicine

## 2023-11-04 NOTE — Telephone Encounter (Signed)
 Patient called in wanting an update on MRI, informed that there is currently not any results in. He wants to know if we can do a STAT reading? Please advise.

## 2023-11-04 NOTE — Telephone Encounter (Signed)
 Go ahead and move it up

## 2023-11-05 NOTE — Addendum Note (Signed)
 Addended by: Monica Becton on: 11/05/2023 01:37 PM   Modules accepted: Orders

## 2023-11-13 NOTE — Discharge Instructions (Signed)

## 2023-11-14 ENCOUNTER — Ambulatory Visit
Admission: RE | Admit: 2023-11-14 | Discharge: 2023-11-14 | Disposition: A | Discharge status: Home / Self Care | Source: Ambulatory Visit | Attending: Sports Medicine | Admitting: Sports Medicine

## 2023-11-14 DIAGNOSIS — M5416 Radiculopathy, lumbar region: Secondary | ICD-10-CM | POA: Diagnosis not present

## 2023-11-14 DIAGNOSIS — M48061 Spinal stenosis, lumbar region without neurogenic claudication: Secondary | ICD-10-CM

## 2023-11-14 MED ORDER — IOPAMIDOL (ISOVUE-M 200) INJECTION 41%
1.0000 mL | Freq: Once | INTRAMUSCULAR | Status: AC
  Administered 2023-11-14: 1 mL via EPIDURAL

## 2023-11-14 MED ORDER — METHYLPREDNISOLONE ACETATE 40 MG/ML INJ SUSP (RADIOLOG
80.0000 mg | Freq: Once | INTRAMUSCULAR | Status: AC
  Administered 2023-11-14: 80 mg via EPIDURAL

## 2023-11-28 ENCOUNTER — Ambulatory Visit: Admitting: Sports Medicine

## 2023-12-06 ENCOUNTER — Other Ambulatory Visit: Payer: Self-pay | Admitting: Sports Medicine

## 2023-12-06 DIAGNOSIS — N529 Male erectile dysfunction, unspecified: Secondary | ICD-10-CM

## 2023-12-21 ENCOUNTER — Other Ambulatory Visit: Payer: Self-pay | Admitting: Sports Medicine

## 2023-12-21 DIAGNOSIS — F39 Unspecified mood [affective] disorder: Secondary | ICD-10-CM

## 2023-12-22 ENCOUNTER — Encounter: Payer: Self-pay | Admitting: Sports Medicine

## 2023-12-22 ENCOUNTER — Ambulatory Visit (INDEPENDENT_AMBULATORY_CARE_PROVIDER_SITE_OTHER): Admitting: Sports Medicine

## 2023-12-22 DIAGNOSIS — M48061 Spinal stenosis, lumbar region without neurogenic claudication: Secondary | ICD-10-CM

## 2023-12-22 NOTE — Progress Notes (Signed)
    Procedures performed today:    None.  Independent interpretation of notes and tests performed by another provider:   None.  Brief History, Exam, Impression, and Recommendations:    Lumbar spinal stenosis Thomas Reilly returns, he has severe spinal stenosis on his recent MRI, we had treated him conservatively with PT, gabapentin , he did really well with the left L5-S1 transforaminal epidural and has almost complete relief of his left-sided pain, still has some right-sided pain so we will proceed with a right sided L5-S1 transforaminal epidural. He understands he can get 6 total epidurals in a year.    ____________________________________________ Joselyn Nicely. Sandy Crumb, M.D., ABFM., CAQSM., AME. Primary Care and Sports Medicine [facility]  Adjunct Professor of [facility] Medicine  [employer]  Restaurant manager, fast food

## 2023-12-22 NOTE — Assessment & Plan Note (Signed)
 Thomas Reilly returns, he has severe spinal stenosis on his recent MRI, we had treated him conservatively with PT, gabapentin , he did really well with the left L5-S1 transforaminal epidural and has almost complete relief of his left-sided pain, still has some right-sided pain so we will proceed with a right sided L5-S1 transforaminal epidural. He understands he can get 6 total epidurals in a year.

## 2023-12-30 NOTE — Discharge Instructions (Signed)

## 2023-12-31 ENCOUNTER — Ambulatory Visit
Admission: RE | Admit: 2023-12-31 | Discharge: 2023-12-31 | Disposition: A | Discharge status: Home / Self Care | Source: Ambulatory Visit | Attending: Sports Medicine | Admitting: Sports Medicine

## 2023-12-31 ENCOUNTER — Other Ambulatory Visit: Payer: Self-pay | Admitting: Sports Medicine

## 2023-12-31 DIAGNOSIS — M5117 Intervertebral disc disorders with radiculopathy, lumbosacral region: Secondary | ICD-10-CM | POA: Diagnosis not present

## 2023-12-31 DIAGNOSIS — I10 Essential (primary) hypertension: Secondary | ICD-10-CM

## 2023-12-31 DIAGNOSIS — M48061 Spinal stenosis, lumbar region without neurogenic claudication: Secondary | ICD-10-CM

## 2023-12-31 MED ORDER — METHYLPREDNISOLONE ACETATE 40 MG/ML INJ SUSP (RADIOLOG
80.0000 mg | Freq: Once | INTRAMUSCULAR | Status: AC
Start: 2023-12-31 — End: 2023-12-31
  Administered 2023-12-31: 80 mg via EPIDURAL

## 2023-12-31 MED ORDER — IOPAMIDOL (ISOVUE-M 200) INJECTION 41%
1.0000 mL | Freq: Once | INTRAMUSCULAR | Status: AC
  Administered 2023-12-31: 1 mL via EPIDURAL

## 2024-01-12 ENCOUNTER — Other Ambulatory Visit: Payer: Self-pay | Admitting: Sports Medicine

## 2024-01-12 DIAGNOSIS — I10 Essential (primary) hypertension: Secondary | ICD-10-CM

## 2024-01-23 ENCOUNTER — Other Ambulatory Visit: Payer: Self-pay | Admitting: Sports Medicine

## 2024-01-23 DIAGNOSIS — E781 Pure hyperglyceridemia: Secondary | ICD-10-CM

## 2024-02-19 DIAGNOSIS — Z860101 Personal history of adenomatous and serrated colon polyps: Secondary | ICD-10-CM | POA: Diagnosis not present

## 2024-02-19 DIAGNOSIS — Z1211 Encounter for screening for malignant neoplasm of colon: Secondary | ICD-10-CM | POA: Diagnosis not present

## 2024-02-19 DIAGNOSIS — K5730 Diverticulosis of large intestine without perforation or abscess without bleeding: Secondary | ICD-10-CM | POA: Diagnosis not present

## 2024-02-19 DIAGNOSIS — K635 Polyp of colon: Secondary | ICD-10-CM | POA: Diagnosis not present

## 2024-02-19 DIAGNOSIS — Z09 Encounter for follow-up examination after completed treatment for conditions other than malignant neoplasm: Secondary | ICD-10-CM | POA: Diagnosis not present

## 2024-02-19 DIAGNOSIS — D124 Benign neoplasm of descending colon: Secondary | ICD-10-CM | POA: Diagnosis not present

## 2024-03-17 ENCOUNTER — Ambulatory Visit: Admitting: Sports Medicine

## 2024-03-17 VITALS — BP 138/68 | HR 55 | Ht 69.0 in | Wt 203.0 lb

## 2024-03-17 DIAGNOSIS — R21 Rash and other nonspecific skin eruption: Secondary | ICD-10-CM

## 2024-03-17 DIAGNOSIS — M48061 Spinal stenosis, lumbar region without neurogenic claudication: Secondary | ICD-10-CM

## 2024-03-17 MED ORDER — CLOBETASOL PROPIONATE 0.05 % EX CREA
1.0000 | TOPICAL_CREAM | Freq: Two times a day (BID) | CUTANEOUS | 0 refills | Status: AC
Start: 2024-03-17 — End: ?

## 2024-03-17 NOTE — Assessment & Plan Note (Signed)
 Thomas Reilly returns, he has known severe lumbar spinal stenosis on his recent MRI, he has done well with transforaminal epidurals at the L5-S1 level, today he is needing a repeat epidural, ordering bilateral L5-S1 transforaminal epidurals.

## 2024-03-17 NOTE — Progress Notes (Signed)
    Procedures performed today:    None.  Independent interpretation of notes and tests performed by another provider:   None.  Brief History, Exam, Impression, and Recommendations:    Lumbar spinal stenosis Thomas Reilly returns, he has known severe lumbar spinal stenosis on his recent MRI, he has done well with transforaminal epidurals at the L5-S1 level, today he is needing a repeat epidural, ordering bilateral L5-S1 transforaminal epidurals.  Skin rash right ankle Atopic looking lesion right medial ankle, nothing on the actual feet or toes to make me suspect tinea. He tells me this has occurred in the past and clobetasol  and triamcinolone  worked for it, clobetasol  works better so we will refill this.    ____________________________________________ Debby PARAS. Curtis, M.D., ABFM., CAQSM., AME. Primary Care and Sports Medicine [facility]  Adjunct Professor of [facility] Medicine  [employer]  Restaurant manager, fast food

## 2024-03-17 NOTE — Assessment & Plan Note (Signed)
 Atopic looking lesion right medial ankle, nothing on the actual feet or toes to make me suspect tinea. He tells me this has occurred in the past and clobetasol  and triamcinolone  worked for it, clobetasol  works better so we will refill this.

## 2024-03-18 NOTE — Discharge Instructions (Signed)

## 2024-03-19 ENCOUNTER — Other Ambulatory Visit: Payer: Self-pay | Admitting: Sports Medicine

## 2024-03-19 ENCOUNTER — Ambulatory Visit
Admission: RE | Admit: 2024-03-19 | Discharge: 2024-03-19 | Disposition: A | Discharge status: Home / Self Care | Source: Ambulatory Visit | Attending: Sports Medicine | Admitting: Sports Medicine

## 2024-03-19 DIAGNOSIS — M48061 Spinal stenosis, lumbar region without neurogenic claudication: Secondary | ICD-10-CM

## 2024-03-19 DIAGNOSIS — M4726 Other spondylosis with radiculopathy, lumbar region: Secondary | ICD-10-CM | POA: Diagnosis not present

## 2024-03-19 MED ORDER — IOPAMIDOL (ISOVUE-M 200) INJECTION 41%
1.0000 mL | Freq: Once | INTRAMUSCULAR | Status: AC
  Administered 2024-03-19: 1 mL via EPIDURAL

## 2024-03-19 MED ORDER — METHYLPREDNISOLONE ACETATE 40 MG/ML INJ SUSP (RADIOLOG
80.0000 mg | Freq: Once | INTRAMUSCULAR | Status: AC
  Administered 2024-03-19: 80 mg via EPIDURAL

## 2024-04-08 ENCOUNTER — Encounter: Payer: Self-pay | Admitting: Sports Medicine

## 2024-05-18 ENCOUNTER — Ambulatory Visit: Payer: Self-pay

## 2024-05-18 NOTE — Telephone Encounter (Signed)
 FYI Only or Action Required?: FYI only for provider.  Patient was last seen in primary care on 03/17/2024 by Curtis Debby PARAS, MD.  Called Nurse Triage reporting Back Pain.  Symptoms began several weeks ago.  Interventions attempted: Nothing.  Symptoms are: unchanged.  Triage Disposition: See PCP When Office is Open (Within 3 Days)  Patient/caregiver understands and will follow disposition?: Yes Copied from CRM #[id number]. Topic: Clinical - Red Word Triage >> May 18, 2024 12:59 PM Montie POUR wrote: Red Word that prompted transfer to Nurse Triage:  He is having sharp pain in his left upper back. Pain level is a 7. This has been going on for 3 weeks. Reason for Disposition  [1] MODERATE back pain (e.g., interferes with normal activities) AND [2] present > 3 days  Answer Assessment - Initial Assessment Questions Pt schedule for acute visit tomorrow. Separate TOC appt also scheduled as pts PCP is no longer at current practice.  1. ONSET: When did the pain begin? (e.g., minutes, hours, days)     3 weeks ago.  2. LOCATION: Where does it hurt? (upper, mid or lower back)     Left upper back.  3. SEVERITY: How bad is the pain?  (e.g., Scale 1-10; mild, moderate, or severe)     4/10 constantly, when turning gets up to 7/10   4. PATTERN: Is the pain constant? (e.g., yes, no; constant, intermittent)      Constant, increased when turning certain way  5. RADIATION: Does the pain shoot into your legs or somewhere else?     No  6. CAUSE:  What do you think is causing the back pain?      Unsure  7. BACK OVERUSE:  Any recent lifting of heavy objects, strenuous work or exercise?     No  8. MEDICINES: What have you taken so far for the pain? (e.g., nothing, acetaminophen, NSAIDS)     No  9. NEUROLOGIC SYMPTOMS: Do you have any weakness, numbness, or problems with bowel/bladder control?     No  10. OTHER SYMPTOMS: Do you have any other symptoms? (e.g., fever,  abdomen pain, burning with urination, blood in urine)       Nose feels blocked. Mild chest tightness starting 2 weeks ago denies No SOB, N/V/D or abdominal pain.  Protocols used: Back Pain-A-AH

## 2024-05-19 ENCOUNTER — Ambulatory Visit (INDEPENDENT_AMBULATORY_CARE_PROVIDER_SITE_OTHER)

## 2024-05-19 ENCOUNTER — Telehealth: Payer: Self-pay | Admitting: Sports Medicine

## 2024-05-19 ENCOUNTER — Ambulatory Visit: Admitting: Physician Assistant

## 2024-05-19 ENCOUNTER — Encounter: Payer: Self-pay | Admitting: Physician Assistant

## 2024-05-19 ENCOUNTER — Ambulatory Visit: Payer: Self-pay | Admitting: Physician Assistant

## 2024-05-19 VITALS — BP 135/60 | HR 59 | Ht 69.0 in | Wt 204.0 lb

## 2024-05-19 DIAGNOSIS — M5030 Other cervical disc degeneration, unspecified cervical region: Secondary | ICD-10-CM | POA: Diagnosis not present

## 2024-05-19 DIAGNOSIS — M47814 Spondylosis without myelopathy or radiculopathy, thoracic region: Secondary | ICD-10-CM | POA: Diagnosis not present

## 2024-05-19 DIAGNOSIS — M549 Dorsalgia, unspecified: Secondary | ICD-10-CM

## 2024-05-19 DIAGNOSIS — R071 Chest pain on breathing: Secondary | ICD-10-CM | POA: Diagnosis not present

## 2024-05-19 DIAGNOSIS — M5134 Other intervertebral disc degeneration, thoracic region: Secondary | ICD-10-CM | POA: Insufficient documentation

## 2024-05-19 DIAGNOSIS — M48061 Spinal stenosis, lumbar region without neurogenic claudication: Secondary | ICD-10-CM | POA: Diagnosis not present

## 2024-05-19 MED ORDER — METHOCARBAMOL 500 MG PO TABS: 500.0000 mg | ORAL_TABLET | Freq: Three times a day (TID) | ORAL | 0 refills | Status: DC

## 2024-05-19 MED ORDER — LIDOCAINE 5 % EX PTCH: 1.0000 | MEDICATED_PATCH | Freq: Two times a day (BID) | CUTANEOUS | 2 refills | Status: DC

## 2024-05-19 MED ORDER — TRAMADOL HCL 50 MG PO TABS: 50.0000 mg | ORAL_TABLET | Freq: Three times a day (TID) | ORAL | 0 refills | Status: DC | PRN

## 2024-05-19 MED ORDER — PREDNISONE 50 MG PO TABS: ORAL_TABLET | ORAL | 0 refills | Status: DC

## 2024-05-19 NOTE — Patient Instructions (Addendum)
 Use lidocaine patches every 12 hours.  Start prednisone  for 5 days.  Robaxin  as needed up to 3x a day.  PT appt made to consider dry needling Tramadol  for break through pain as needed  Imaging ordered

## 2024-05-19 NOTE — Progress Notes (Signed)
Lungs look great.

## 2024-05-19 NOTE — Progress Notes (Signed)
 Lots of arthritis is thoracic spine.

## 2024-05-20 ENCOUNTER — Other Ambulatory Visit (HOSPITAL_COMMUNITY): Payer: Self-pay

## 2024-05-20 ENCOUNTER — Telehealth: Payer: Self-pay

## 2024-05-20 NOTE — Telephone Encounter (Signed)
 Patient informed of Lidocaine patches approval and co-pay amount.

## 2024-05-20 NOTE — Telephone Encounter (Signed)
 Pharmacy Patient Advocate Encounter  Received notification from [facility] that Prior Authorization for Lidocaine 5% patches has been APPROVED from 05/20/24 to 08/04/25. Spoke to pharmacy to process.Copay is $72.    PA #/Case ID/Reference #: [id number]

## 2024-05-20 NOTE — Telephone Encounter (Signed)
 Pharmacy Patient Advocate Encounter   Received notification from Patient Pharmacy that prior authorization for Lidocaine 5% patches is required/requested.   Insurance verification completed.   The patient is insured through [facility].   Per test claim: PA required; PA submitted to above mentioned insurance via Latent Key/confirmation #/EOC [id number] Status is pending

## 2024-05-21 ENCOUNTER — Ambulatory Visit: Admitting: Urgent Care

## 2024-05-21 ENCOUNTER — Encounter: Payer: Self-pay | Admitting: Urgent Care

## 2024-05-21 VITALS — BP 122/72 | HR 65 | Ht 69.0 in | Wt 206.0 lb

## 2024-05-21 DIAGNOSIS — R7303 Prediabetes: Secondary | ICD-10-CM | POA: Insufficient documentation

## 2024-05-21 DIAGNOSIS — F39 Unspecified mood [affective] disorder: Secondary | ICD-10-CM | POA: Diagnosis not present

## 2024-05-21 DIAGNOSIS — I10 Essential (primary) hypertension: Secondary | ICD-10-CM | POA: Diagnosis not present

## 2024-05-21 DIAGNOSIS — E781 Pure hyperglyceridemia: Secondary | ICD-10-CM | POA: Diagnosis not present

## 2024-05-21 DIAGNOSIS — N529 Male erectile dysfunction, unspecified: Secondary | ICD-10-CM

## 2024-05-21 MED ORDER — AMLODIPINE BESYLATE 5 MG PO TABS: 5.0000 mg | ORAL_TABLET | Freq: Every day | ORAL | 0 refills | Status: AC

## 2024-05-21 MED ORDER — AMLODIPINE BESYLATE 5 MG PO TABS: 5.0000 mg | ORAL_TABLET | Freq: Every day | ORAL | 3 refills | Status: DC

## 2024-05-21 MED ORDER — SERTRALINE HCL 25 MG PO TABS: 25.0000 mg | ORAL_TABLET | Freq: Every day | ORAL | 3 refills | Status: AC

## 2024-05-21 MED ORDER — FENOFIBRATE 160 MG PO TABS: 160.0000 mg | ORAL_TABLET | Freq: Every day | ORAL | 3 refills | Status: AC

## 2024-05-21 MED ORDER — LISINOPRIL 40 MG PO TABS: 40.0000 mg | ORAL_TABLET | Freq: Every day | ORAL | 3 refills | Status: AC

## 2024-05-21 MED ORDER — TADALAFIL 5 MG PO TABS: 5.0000 mg | ORAL_TABLET | Freq: Every day | ORAL | 3 refills | Status: DC

## 2024-05-21 MED ORDER — ATORVASTATIN CALCIUM 40 MG PO TABS: 40.0000 mg | ORAL_TABLET | Freq: Every day | ORAL | 3 refills | Status: AC

## 2024-05-21 MED ORDER — TADALAFIL 5 MG PO TABS: 5.0000 mg | ORAL_TABLET | Freq: Every day | ORAL | 0 refills | Status: DC

## 2024-05-21 NOTE — Progress Notes (Signed)
 Acute Office Visit  Subjective:     Patient ID: Thomas Reilly, male    DOB: 06/17/1956, 68 y.o.   MRN: [id number]  Chief Complaint  Patient presents with   Back Pain    3 weeks    HPI .SABRADiscussed the use of AI scribe software for clinical note transcription with the patient, who gave verbal consent to proceed.  History of Present Illness Thomas Reilly is a 68 year old male who presents with upper back pain and difficulty breathing.  Upper back pain, hx of cervical and lumbar DDD - Onset approximately three weeks ago - Stabbing sensation primarily on the left side of the upper back, deep and more towards the left - Pain worsens with certain movements, such as turning to the left - Distinct from previous low back pain associated with lumbar spinal stenosis - Pain is not exacerbated by coughing - No pain with spinal palpation - No recent heavy lifting or sudden movements preceding onset - Difficulty sleeping due to pain, which varies with position  Respiratory symptoms - Difficulty breathing through the nose since onset of upper back pain - Pain described as feeling 'in my lungs or something'  Medication use and response - Not currently taking prescribed medications for back pain, including tramadol , Flexeril , meloxicam , and gabapentin , as these were associated with previous low back pain - No recent use of TENS unit - No significant relief from muscle relaxers such as Flexeril  in the past  History of lumbar spinal stenosis - Previous low back pain associated with lumbar spinal stenosis - History of epidural injections at L5 S1, which provided temporary relief    ROS See HPI.     Objective:    BP 135/60   Pulse (!) 59   Ht 5' 9 (1.753 m)   Wt 204 lb (92.5 kg)   SpO2 98%   BMI 30.13 kg/m  BP Readings from Last 3 Encounters:  05/19/24 135/60  03/19/24 128/86  03/17/24 138/68   Wt Readings from Last 3 Encounters:  05/19/24 204 lb (92.5 kg)  03/17/24  203 lb (92.1 kg)  09/09/23 204 lb (92.5 kg)      Physical Exam Constitutional:      Appearance: Normal appearance. He is obese.  Cardiovascular:     Rate and Rhythm: Normal rate and regular rhythm.  Pulmonary:     Effort: Pulmonary effort is normal.     Breath sounds: Normal breath sounds. No wheezing or rhonchi.  Musculoskeletal:     Right lower leg: No edema.     Left lower leg: No edema.     Comments: No cervical/thoracic/lumbar tenderness to palpation over spine.  Left sided paraspinal muscles in upper back tight and tender to palpation.  NROM of bilateral upper extremities with 5/5 strength.  NROM of neck.  Pain in upper back with twisting movement at waist.   Neurological:     General: No focal deficit present.     Mental Status: He is alert and oriented to person, place, and time.  Psychiatric:        Mood and Affect: Mood normal.         Assessment & Plan:  SABRASABRAJoell was seen today for back pain.  Diagnoses and all orders for this visit:  Upper back pain on left side -     predniSONE  (DELTASONE ) 50 MG tablet; Take one tablet for 5 days. -     methocarbamol  (ROBAXIN ) 500 MG tablet; Take 1 tablet (500 mg  total) by mouth 3 (three) times daily. For muscle relaxation. -     Ambulatory referral to Physical Therapy -     DG Thoracic Spine W/Swimmers; Future -     lidocaine (LIDODERM) 5 %; Place 1 patch onto the skin every 12 (twelve) hours. Remove & Discard patch within 12 hours or as directed by MD  Spinal stenosis of lumbar region without neurogenic claudication -     methocarbamol  (ROBAXIN ) 500 MG tablet; Take 1 tablet (500 mg total) by mouth 3 (three) times daily. For muscle relaxation.  DDD (degenerative disc disease), cervical -     predniSONE  (DELTASONE ) 50 MG tablet; Take one tablet for 5 days. -     methocarbamol  (ROBAXIN ) 500 MG tablet; Take 1 tablet (500 mg total) by mouth 3 (three) times daily. For muscle relaxation. -     Ambulatory referral to Physical  Therapy -     traMADol  (ULTRAM ) 50 MG tablet; Take 1 tablet (50 mg total) by mouth every 8 (eight) hours as needed for moderate pain (pain score 4-6).  Chest pain varying with breathing -     DG Chest 2 View; Future   Assessment & Plan Left thoracic paraspinal muscle strain Acute left thoracic paraspinal muscle strain likely involving the rhomboid muscle, exacerbated by spinal arthritis. Differential includes muscle strain versus disc-related pain, but muscular origin is favored. - Order chest x-ray and thoracic spine x-ray to rule out lung or spinal issues. STAT CXR negative for any abnormal findings - Prescribe 5-day course of prednisone  for inflammation. - Prescribe Robaxin  (methocarbamol ) up to three times daily, preferably in the evening. - Prescribe lidocaine patches every 12 hours, pending insurance approval. - Tramadol  for break through pain as needed for all MSK issues - Provide rhomboid muscle exercises. - Refer to physical therapy for dry needling if symptoms persist.    Massie Mees, PA-C

## 2024-05-21 NOTE — Progress Notes (Signed)
 Established Patient Office Visit  Subjective:  Patient ID: Thomas Reilly, male    DOB: May 31, 1956  Age: 68 y.o. MRN: [id number]  Chief Complaint  Patient presents with   Establish Care    Pleasant [phone] presents for routine chronic medical management and transfer of care.  Pt with hx of HTN. Previously on just lisinopril , BP was noted to be high so norvasc  added in June. Pt monitors BP at home with readings usually in the 120/70 range. Tolerating meds.  Last lipid panel in January was WNL. Pt on statin and fibrates, no side effects.  Taking daily cialis  for ED, not BPH but this does appear to be working.  Pt gets Lumbar injections, last one six weeks ago. Doing well overall. Had a rhomboid issue a few days ago, muscle relaxers and tramadol  working well. Pt denies any acute pain today.      Patient Active Problem List   Diagnosis Date Noted   Prediabetes 05/21/2024   Upper back pain on left side 05/19/2024   Thoracic degenerative disc disease 05/19/2024   Skin rash right ankle 03/17/2024   Lumbar spinal stenosis 09/05/2023   Bilateral carpal tunnel syndrome 09/05/2023   Cervical facet joint syndrome 09/05/2022   Obesity 01/25/2022   Allergic rhinitis 12/19/2021   External hemorrhoid 05/25/2021   Systolic murmur 01/24/2021   Erectile dysfunction 10/06/2018   Rotator cuff tendinitis, left 11/07/2017   History of total left hip arthroplasty 11/07/2017   Right proximal biceps tear 03/03/2017   Cubital tunnel syndrome of both upper extremities 12/11/2016   Diverticulitis of colon 10/30/2015   Mood disorder 10/16/2015   Male hypogonadism 09/18/2015   Essential hypertension, benign 06/16/2015   Annual physical exam 06/16/2015   Seborrheic keratosis 06/16/2015   Hypertriglyceridemia 06/16/2015   Past Medical History:  Diagnosis Date   Hypertension    Past Surgical History:  Procedure Laterality Date   BACK SURGERY     HERNIA REPAIR     TONSILLECTOMY     Social  History   Tobacco Use   Smoking status: Former    Current packs/day: 0.00    Types: Cigarettes    Quit date: 08/05/1997    Years since quitting: 26.8   Smokeless tobacco: Never  Substance Use Topics   Alcohol use: Yes    Alcohol/week: 21.0 standard drinks of alcohol    Types: 21 Standard drinks or equivalent per week    Comment: 3 beers a day   Drug use: No      ROS: as noted in HPI  Objective:     BP 122/72   Pulse 65   Ht 5' 9 (1.753 m)   Wt 206 lb (93.4 kg)   SpO2 97%   BMI 30.42 kg/m  BP Readings from Last 3 Encounters:  05/21/24 122/72  05/19/24 135/60  03/19/24 128/86   Wt Readings from Last 3 Encounters:  05/21/24 206 lb (93.4 kg)  05/19/24 204 lb (92.5 kg)  03/17/24 203 lb (92.1 kg)      Physical Exam Vitals and nursing note reviewed. Exam conducted with a chaperone present.  Constitutional:      General: He is not in acute distress.    Appearance: Normal appearance. He is not ill-appearing, toxic-appearing or diaphoretic.  HENT:     Head: Normocephalic and atraumatic.     Right Ear: External ear normal.     Left Ear: External ear normal.     Nose: Nose normal.  Eyes:  General: No scleral icterus.    Pupils: Pupils are equal, round, and reactive to light.  Cardiovascular:     Rate and Rhythm: Normal rate and regular rhythm.     Heart sounds: No murmur heard.    No friction rub. No gallop.  Pulmonary:     Effort: Pulmonary effort is normal. No respiratory distress.     Breath sounds: Normal breath sounds. No stridor. No wheezing or rhonchi.  Musculoskeletal:     Cervical back: Normal range of motion and neck supple. No rigidity or tenderness.  Lymphadenopathy:     Cervical: No cervical adenopathy.  Skin:    General: Skin is warm and dry.     Findings: No erythema or rash.  Neurological:     General: No focal deficit present.     Mental Status: He is alert and oriented to person, place, and time.     Gait: Gait normal.  Psychiatric:         Mood and Affect: Mood normal.        Behavior: Behavior normal.      No results found for any visits on 05/21/24.  Last CBC Lab Results  Component Value Date   WBC 6.8 09/05/2023   HGB 15.3 09/05/2023   HCT 44.6 09/05/2023   MCV 91 09/05/2023   MCH 31.2 09/05/2023   RDW 12.4 09/05/2023   PLT 213 09/05/2023   Last metabolic panel Lab Results  Component Value Date   GLUCOSE 98 09/05/2023   NA 138 09/05/2023   K 4.6 09/05/2023   CL 101 09/05/2023   CO2 22 09/05/2023   BUN 10 09/05/2023   CREATININE 0.81 09/05/2023   EGFR 98 09/05/2023   CALCIUM  9.4 09/05/2023   PROT 6.5 09/05/2023   ALBUMIN 4.5 09/05/2023   LABGLOB 2.0 09/05/2023   BILITOT 0.7 09/05/2023   ALKPHOS 43 (L) 09/05/2023   AST 32 09/05/2023   ALT 34 09/05/2023   Last lipids Lab Results  Component Value Date   CHOL 143 09/05/2023   HDL 59 09/05/2023   LDLCALC 67 09/05/2023   LDLDIRECT 139 (H) 09/18/2015   TRIG 88 09/05/2023   CHOLHDL 2.4 09/05/2023   Last hemoglobin A1c Lab Results  Component Value Date   HGBA1C 5.7 (H) 09/05/2023   Last thyroid  functions Lab Results  Component Value Date   TSH 0.919 09/05/2023   Last vitamin D  Lab Results  Component Value Date   VD25OH 36 06/16/2015   Last vitamin B12 and Folate No results found for: VITAMINB12, FOLATE    The 10-year ASCVD risk score (Arnett DK, et al., 2019) is: 10.8%  Assessment & Plan:  Essential hypertension, benign -     Lisinopril ; Take 1 tablet (40 mg total) by mouth daily.  Dispense: 90 tablet; Refill: 3 -     amLODIPine  Besylate; Take 1 tablet (5 mg total) by mouth daily.  Dispense: 14 tablet; Refill: 0 -     CMP14+EGFR -     CBC with Differential/Platelet -     Hemoglobin A1c  Hypertriglyceridemia -     Atorvastatin  Calcium ; Take 1 tablet (40 mg total) by mouth daily.  Dispense: 90 tablet; Refill: 3 -     Fenofibrate ; Take 1 tablet (160 mg total) by mouth daily.  Dispense: 90 tablet; Refill: 3  Mood  disorder -     Sertraline  HCl; Take 1 tablet (25 mg total) by mouth daily.  Dispense: 90 tablet; Refill: 3  Erectile dysfunction, unspecified erectile dysfunction  type -     Tadalafil ; Take 1 tablet (5 mg total) by mouth daily. as directed  Dispense: 14 tablet; Refill: 0  Prediabetes -     Hemoglobin A1c  All chronic conditions well controlled. Pt without acute complaints today. Due for lab recheck and med refills Preventive maintenance up to date    Return in about 6 months (around 11/19/2024) for Annual Physical.   Benton LITTIE Gave, PA

## 2024-05-21 NOTE — Patient Instructions (Signed)
 Continue all medications as ordered. Continue to monitor blood pressure at home, goal 120/80.  Please schedule your medicare wellness exam

## 2024-05-22 LAB — CBC WITH DIFFERENTIAL/PLATELET
Basophils Absolute: 0.1 x10E3/uL (ref 0.0–0.2)
Basos: 1 %
EOS (ABSOLUTE): 0.1 x10E3/uL (ref 0.0–0.4)
Eos: 1 %
Hematocrit: 44.1 % (ref 37.5–51.0)
Hemoglobin: 14.4 g/dL (ref 13.0–17.7)
Immature Grans (Abs): 0.1 x10E3/uL (ref 0.0–0.1)
Immature Granulocytes: 1 %
Lymphocytes Absolute: 1.4 x10E3/uL (ref 0.7–3.1)
Lymphs: 17 %
MCH: 31 pg (ref 26.6–33.0)
MCHC: 32.7 g/dL (ref 31.5–35.7)
MCV: 95 fL (ref 79–97)
Monocytes Absolute: 0.6 x10E3/uL (ref 0.1–0.9)
Monocytes: 7 %
Neutrophils Absolute: 6.2 x10E3/uL (ref 1.4–7.0)
Neutrophils: 73 %
Platelets: 225 x10E3/uL (ref 150–450)
RBC: 4.65 x10E6/uL (ref 4.14–5.80)
RDW: 12.3 % (ref 11.6–15.4)
WBC: 8.5 x10E3/uL (ref 3.4–10.8)

## 2024-05-22 LAB — CMP14+EGFR
ALT: 39 IU/L (ref 0–44)
AST: 30 IU/L (ref 0–40)
Albumin: 4.4 g/dL (ref 3.9–4.9)
Alkaline Phosphatase: 36 IU/L — ABNORMAL LOW (ref 47–123)
BUN/Creatinine Ratio: 20 (ref 10–24)
BUN: 18 mg/dL (ref 8–27)
Bilirubin Total: 0.4 mg/dL (ref 0.0–1.2)
CO2: 22 mmol/L (ref 20–29)
Calcium: 9.4 mg/dL (ref 8.6–10.2)
Chloride: 103 mmol/L (ref 96–106)
Creatinine, Ser: 0.92 mg/dL (ref 0.76–1.27)
Globulin, Total: 2 g/dL (ref 1.5–4.5)
Glucose: 109 mg/dL — ABNORMAL HIGH (ref 70–99)
Potassium: 4.3 mmol/L (ref 3.5–5.2)
Sodium: 140 mmol/L (ref 134–144)
Total Protein: 6.4 g/dL (ref 6.0–8.5)
eGFR: 92 mL/min/1.73 (ref >59)

## 2024-05-22 LAB — HEMOGLOBIN A1C
Est. average glucose Bld gHb Est-mCnc: 111 mg/dL
Hgb A1c MFr Bld: 5.5 % (ref 4.8–5.6)

## 2024-05-23 ENCOUNTER — Ambulatory Visit: Payer: Self-pay | Admitting: Urgent Care

## 2024-05-26 ENCOUNTER — Telehealth: Payer: Self-pay | Admitting: Pharmacy Technician

## 2024-05-26 ENCOUNTER — Other Ambulatory Visit (HOSPITAL_COMMUNITY): Payer: Self-pay

## 2024-05-26 NOTE — Telephone Encounter (Signed)
 Pharmacy Patient Advocate Encounter   Received notification from [employer] that prior authorization for Tadalafil  5MG  tablets is required/requested.   Insurance verification completed.   The patient is insured through [place].   Drug not covered by Medicare Part D when used to treat ED. Patient and pharmacy is aware.

## 2024-05-31 ENCOUNTER — Telehealth: Payer: Self-pay

## 2024-05-31 ENCOUNTER — Other Ambulatory Visit: Payer: Self-pay | Admitting: Urgent Care

## 2024-05-31 NOTE — Telephone Encounter (Signed)
 Rec'd fax from [employer] regarding a meloxicam  refill. Pt denied using this medication during his last OV.   Called patient to verify this was correct. Pt verbally stated that he no longer takes Meloxicam .   Fax was marked denied and form was returned to [employer]. Confirmation sheet obtained.

## 2024-06-04 ENCOUNTER — Ambulatory Visit (INDEPENDENT_AMBULATORY_CARE_PROVIDER_SITE_OTHER): Admitting: Urgent Care

## 2024-06-04 ENCOUNTER — Encounter: Payer: Self-pay | Admitting: Urgent Care

## 2024-06-04 VITALS — BP 122/62 | HR 61 | Ht 69.0 in | Wt 205.0 lb

## 2024-06-04 DIAGNOSIS — Z0000 Encounter for general adult medical examination without abnormal findings: Secondary | ICD-10-CM

## 2024-06-04 NOTE — Progress Notes (Signed)
 Annual Wellness Visit     Patient: Thomas Reilly, Male    DOB: 18-May-1956, 68 y.o.   MRN: [id number]  Subjective  Chief Complaint  Patient presents with   welcome to medicare    Ygnacio Fecteau is a 68 y.o. male who presents today for his Annual Wellness Visit. He reports consuming a general diet. The patient has a physically strenuous job, but has no regular exercise apart from work.  He generally feels well. He reports sleeping well. He does not have additional problems to discuss today.   HPI  Vision:Not within last year  and Dental: No current dental problems and Receives regular dental care   Patient Active Problem List   Diagnosis Date Noted   Prediabetes 05/21/2024   Upper back pain on left side 05/19/2024   Thoracic degenerative disc disease 05/19/2024   Skin rash right ankle 03/17/2024   Lumbar spinal stenosis 09/05/2023   Bilateral carpal tunnel syndrome 09/05/2023   Cervical facet joint syndrome 09/05/2022   Obesity 01/25/2022   Allergic rhinitis 12/19/2021   External hemorrhoid 05/25/2021   Systolic murmur 01/24/2021   Erectile dysfunction 10/06/2018   Rotator cuff tendinitis, left 11/07/2017   History of total left hip arthroplasty 11/07/2017   Right proximal biceps tear 03/03/2017   Cubital tunnel syndrome of both upper extremities 12/11/2016   Diverticulitis of colon 10/30/2015   Mood disorder 10/16/2015   Male hypogonadism 09/18/2015   Essential hypertension, benign 06/16/2015   Annual physical exam 06/16/2015   Seborrheic keratosis 06/16/2015   Hypertriglyceridemia 06/16/2015   Past Medical History:  Diagnosis Date   Hypertension    Past Surgical History:  Procedure Laterality Date   BACK SURGERY     HERNIA REPAIR     TONSILLECTOMY     Social History   Tobacco Use   Smoking status: Former    Current packs/day: 0.00    Types: Cigarettes    Quit date: 08/05/1997    Years since quitting: 26.8   Smokeless tobacco: Never  Substance Use  Topics   Alcohol use: Yes    Alcohol/week: 21.0 standard drinks of alcohol    Types: 21 Standard drinks or equivalent per week    Comment: 3 beers a day   Drug use: No      Medications: Outpatient Medications Prior to Visit  Medication Sig   amLODipine  (NORVASC ) 5 MG tablet Take 1 tablet (5 mg total) by mouth daily.   atorvastatin  (LIPITOR) 40 MG tablet Take 1 tablet (40 mg total) by mouth daily.   Blood Pressure Monitoring (BLOOD PRESSURE CUFF) MISC Check blood pressure at home once or twice a day after sitting relaxed for 15 minutes   clobetasol  cream (TEMOVATE ) 0.05 % Apply 1 Application topically 2 (two) times daily.   fenofibrate  160 MG tablet Take 1 tablet (160 mg total) by mouth daily.   Glucosamine HCl (GLUCOSAMINE PO) Take by mouth.   lisinopril  (ZESTRIL ) 40 MG tablet Take 1 tablet (40 mg total) by mouth daily.   meloxicam  (MOBIC ) 15 MG tablet Take 15 mg by mouth daily.   sertraline  (ZOLOFT ) 25 MG tablet Take 1 tablet (25 mg total) by mouth daily.   tadalafil  (CIALIS ) 5 MG tablet Take 1 tablet (5 mg total) by mouth daily. as directed   [DISCONTINUED] lidocaine (LIDODERM) 5 % Place 1 patch onto the skin every 12 (twelve) hours. Remove & Discard patch within 12 hours or as directed by MD   [DISCONTINUED] methocarbamol  (ROBAXIN ) 500 MG tablet Take  1 tablet (500 mg total) by mouth 3 (three) times daily. For muscle relaxation.   [DISCONTINUED] predniSONE  (DELTASONE ) 50 MG tablet Take one tablet for 5 days.   [DISCONTINUED] traMADol  (ULTRAM ) 50 MG tablet Take 1 tablet (50 mg total) by mouth every 8 (eight) hours as needed for moderate pain (pain score 4-6).   No facility-administered medications prior to visit.    No Known Allergies  Patient Care Team: Lowella Benton CROME, [place] as PCP - General (Physician Assistant)  ROS Complete 12 point ROS performed with all pertinent positives listed in HPI     Objective  BP 122/62   Pulse 61   Ht 5' 9 (1.753 m)   Wt 205 lb (93 kg)    SpO2 98%   BMI 30.27 kg/m  BP Readings from Last 3 Encounters:  06/04/24 122/62  05/21/24 122/72  05/19/24 135/60   Wt Readings from Last 3 Encounters:  06/04/24 205 lb (93 kg)  05/21/24 206 lb (93.4 kg)  05/19/24 204 lb (92.5 kg)      Physical Exam Vitals and nursing note reviewed.  Constitutional:      General: He is not in acute distress.    Appearance: Normal appearance. He is not ill-appearing, toxic-appearing or diaphoretic.  HENT:     Head: Normocephalic and atraumatic.     Right Ear: Tympanic membrane, ear canal and external ear normal. There is no impacted cerumen.     Left Ear: Tympanic membrane, ear canal and external ear normal. There is no impacted cerumen.     Nose: Nose normal.     Mouth/Throat:     Mouth: Mucous membranes are moist.     Pharynx: Oropharynx is clear. No oropharyngeal exudate or posterior oropharyngeal erythema.  Eyes:     General: No scleral icterus.       Right eye: No discharge.        Left eye: No discharge.     Extraocular Movements: Extraocular movements intact.     Pupils: Pupils are equal, round, and reactive to light.  Neck:     Thyroid : No thyroid  mass, thyromegaly or thyroid  tenderness.  Cardiovascular:     Rate and Rhythm: Normal rate and regular rhythm.     Pulses: Normal pulses.     Heart sounds: No murmur heard. Pulmonary:     Effort: Pulmonary effort is normal. No respiratory distress.     Breath sounds: Normal breath sounds. No stridor. No wheezing or rhonchi.  Abdominal:     General: Abdomen is flat. Bowel sounds are normal. There is no distension.     Palpations: Abdomen is soft. There is no mass.     Tenderness: There is no abdominal tenderness. There is no guarding.  Musculoskeletal:     Cervical back: Normal range of motion and neck supple. No rigidity or tenderness.     Right lower leg: No edema.     Left lower leg: No edema.  Lymphadenopathy:     Cervical: No cervical adenopathy.  Skin:    General: Skin is  warm and dry.     Coloration: Skin is not jaundiced.     Findings: No bruising, erythema or rash.  Neurological:     General: No focal deficit present.     Mental Status: He is alert and oriented to person, place, and time.     Sensory: No sensory deficit.     Motor: No weakness.  Psychiatric:        Mood and Affect:  Mood normal.        Behavior: Behavior normal.      Hearing Screening   500Hz  1000Hz  2000Hz  4000Hz   Right ear 20 20 20    Left ear 20 20 20  40   Vision Screening   Right eye Left eye Both eyes  Without correction 20/25 20/25 20/25   With correction        Most recent functional status assessment:     No data to display         Most recent fall risk assessment:    06/04/2024   11:51 AM  Fall Risk   Falls in the past year? 0  Number falls in past yr: 0  Injury with Fall? 0    Most recent depression screenings:    06/04/2024   11:51 AM 05/21/2024    9:33 AM  PHQ 2/9 Scores  PHQ - 2 Score 0 0   Most recent cognitive screening:     No data to display         Most recent Audit-C alcohol use screening     No data to display         A score of 3 or more in women, and 4 or more in men indicates increased risk for alcohol abuse, EXCEPT if all of the points are from question 1   Vision/Hearing Screen: Hearing Screening   500Hz  1000Hz  2000Hz  4000Hz   Right ear 20 20 20    Left ear 20 20 20  40   Vision Screening   Right eye Left eye Both eyes  Without correction 20/25 20/25 20/25   With correction      EKG: normal EKG, normal sinus rhythm .  Last CBC Lab Results  Component Value Date   WBC 8.5 05/21/2024   HGB 14.4 05/21/2024   HCT 44.1 05/21/2024   MCV 95 05/21/2024   MCH 31.0 05/21/2024   RDW 12.3 05/21/2024   PLT 225 05/21/2024   Last metabolic panel Lab Results  Component Value Date   GLUCOSE 109 (H) 05/21/2024   NA 140 05/21/2024   K 4.3 05/21/2024   CL 103 05/21/2024   CO2 22 05/21/2024   BUN 18 05/21/2024    CREATININE 0.92 05/21/2024   EGFR 92 05/21/2024   CALCIUM  9.4 05/21/2024   PROT 6.4 05/21/2024   ALBUMIN 4.4 05/21/2024   LABGLOB 2.0 05/21/2024   BILITOT 0.4 05/21/2024   ALKPHOS 36 (L) 05/21/2024   AST 30 05/21/2024   ALT 39 05/21/2024   Last lipids Lab Results  Component Value Date   CHOL 143 09/05/2023   HDL 59 09/05/2023   LDLCALC 67 09/05/2023   LDLDIRECT 139 (H) 09/18/2015   TRIG 88 09/05/2023   CHOLHDL 2.4 09/05/2023   Last hemoglobin A1c Lab Results  Component Value Date   HGBA1C 5.5 05/21/2024   Last thyroid  functions Lab Results  Component Value Date   TSH 0.919 09/05/2023   Last vitamin D  Lab Results  Component Value Date   VD25OH 36 06/16/2015   Last vitamin B12 and Folate No results found for: VITAMINB12, FOLATE    No results found for any visits on 06/04/24.    Assessment & Plan   Annual wellness visit done today including the all of the following: Reviewed patient's Family Medical History Reviewed and updated list of patient's medical providers Assessment of cognitive impairment was done Assessed patient's functional ability Established a written schedule for health screening services Health Risk Assessent Completed and Reviewed  Exercise Activities and  Dietary recommendations  Goals   - Retirement - would like to travel more. - Remain pain free - this will be achieved by intermittent epidural injections and continued meloxicam  - increased movement and flexibility     Immunization History  Administered Date(s) Administered   Influenza,inj,Quad PF,6+ Mos 07/14/2014   Tdap 01/18/2015   Zoster Recombinant(Shingrix) 01/24/2021, 05/11/2021    Health Maintenance  Topic Date Due   COVID-19 Vaccine (1) 06/06/2024 (Originally 03/29/1961)   Pneumococcal Vaccine: 50+ Years (1 of 1 - PCV) 09/08/2024 (Originally 03/28/2008)   Influenza Vaccine  11/02/2024 (Originally 03/05/2024)   DTaP/Tdap/Td (2 - Td or Tdap) 01/17/2025   Medicare Annual  Wellness (AWV)  06/04/2025   Colonoscopy  02/18/2034   Hepatitis C Screening  Completed   Zoster Vaccines- Shingrix  Completed   Meningococcal B Vaccine  Aged Out     Discussed health benefits of physical activity, and encouraged him to engage in regular exercise appropriate for his age and condition.    Problem List Items Addressed This Visit   None Visit Diagnoses       Encounter for Medicare annual wellness exam    -  Primary   Relevant Orders   Rhythm ECG, report        Return in about 1 year (around 05/23/2025).     Benton LITTIE Gave, PA

## 2024-06-04 NOTE — Patient Instructions (Addendum)
  Mr. Thomas Reilly , Thank you for taking time to come for your Medicare Wellness Visit. I appreciate your ongoing commitment to your health goals. Please review the following plan we discussed and let me know if I can assist you in the future.   These are the goals we discussed:  Goals   - Retirement - would like to travel more. - Remain pain free - this will be achieved by intermittent epidural injections and continued meloxicam  - increased movement and flexibility     This is a list of the screening recommended for you and due dates:  Health Maintenance  Topic Date Due   Medicare Annual Wellness Visit  Never done   COVID-19 Vaccine (1) 06/06/2024*   Pneumococcal Vaccine for age over 59 (1 of 1 - PCV) 09/08/2024*   Flu Shot  11/02/2024*   DTaP/Tdap/Td vaccine (2 - Td or Tdap) 01/17/2025   Colon Cancer Screening  02/18/2034   Hepatitis C Screening  Completed   Zoster (Shingles) Vaccine  Completed   Meningitis B Vaccine  Aged Out  *Topic was postponed. The date shown is not the original due date.

## 2024-06-09 NOTE — Addendum Note (Signed)
 Addended by: BONNY JON DEL on: 06/09/2024 01:25 PM   Modules accepted: Orders

## 2024-06-21 ENCOUNTER — Telehealth: Payer: Self-pay

## 2024-06-21 MED ORDER — MELOXICAM 15 MG PO TABS: 15.0000 mg | ORAL_TABLET | Freq: Every day | ORAL | 3 refills | Status: AC

## 2024-06-21 NOTE — Telephone Encounter (Signed)
 Copied from CRM #[id number]. Topic: Clinical - Prescription Issue >> Jun 21, 2024 10:16 AM Joesph NOVAK wrote: Reason for CRM: Patient states [facility] home delivery has been trying to contact [employer]. He wants his meloxicam  (MOBIC ) 15 MG tablet switched to [facility]. If she does not respond soon, [facility] will cancel the prescription. Can someone follow up with patient? [phone].

## 2024-07-06 ENCOUNTER — Other Ambulatory Visit: Payer: Self-pay | Admitting: Urgent Care

## 2024-07-06 DIAGNOSIS — N529 Male erectile dysfunction, unspecified: Secondary | ICD-10-CM

## 2024-07-06 NOTE — Telephone Encounter (Unsigned)
 Copied from CRM #[id number]. Topic: Clinical - Medication Refill >> Jul 06, 2024 10:21 AM Nathanel BROCKS wrote: Medication: tadalafil  (CIALIS ) 5 MG tablet  Has the patient contacted their pharmacy? Yes  This is the patient's preferred pharmacy:  [facility] [place], [place] - [address] [place] [address] Phone: [phone] Fax: [phone]  Is this the correct pharmacy for this prescription? Yes If no, delete pharmacy and type the correct one.   Has the prescription been filled recently? Yes  Is the patient out of the medication? Yes  Has the patient been seen for an appointment in the last year OR does the patient have an upcoming appointment? Yes  Can we respond through MyChart? No  Agent: Please be advised that Rx refills may take up to 3 business days. We ask that you follow-up with your pharmacy.

## 2024-07-08 MED ORDER — TADALAFIL 5 MG PO TABS: 5.0000 mg | ORAL_TABLET | Freq: Every day | ORAL | 2 refills | Status: AC

## 2024-07-08 NOTE — Telephone Encounter (Signed)
 Requesting rx rf of Tadalafil  5mg   Last written 05/21/2024 Last OV 06/04/2024 Upcoming appt 11/19/2024 physical

## 2024-11-19 ENCOUNTER — Encounter: Admitting: Urgent Care

## 2025-06-07 ENCOUNTER — Ambulatory Visit
